# Patient Record
Sex: Female | Born: 1985
Health system: Southern US, Community
[De-identification: ages and names within clinical notes are randomized; demographics above are authoritative.]

## PROBLEM LIST (undated history)

## (undated) ENCOUNTER — Inpatient Hospital Stay (HOSPITAL_COMMUNITY): Payer: Self-pay

## (undated) DIAGNOSIS — F419 Anxiety disorder, unspecified: Secondary | ICD-10-CM

---
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
## 2013-05-23 ENCOUNTER — Emergency Department (HOSPITAL_BASED_OUTPATIENT_CLINIC_OR_DEPARTMENT_OTHER)
Admission: EM | Admit: 2013-05-23 | Discharge: 2013-05-23 | Disposition: A | Discharge status: Home / Self Care | Payer: Commercial Managed Care - PPO | Attending: Emergency Medicine | Admitting: Emergency Medicine

## 2013-05-23 ENCOUNTER — Encounter (HOSPITAL_BASED_OUTPATIENT_CLINIC_OR_DEPARTMENT_OTHER): Payer: Self-pay | Admitting: Emergency Medicine

## 2013-05-23 DIAGNOSIS — Z79899 Other long term (current) drug therapy: Secondary | ICD-10-CM | POA: Insufficient documentation

## 2013-05-23 DIAGNOSIS — T50905A Adverse effect of unspecified drugs, medicaments and biological substances, initial encounter: Secondary | ICD-10-CM

## 2013-05-23 DIAGNOSIS — T483X5A Adverse effect of antitussives, initial encounter: Secondary | ICD-10-CM | POA: Insufficient documentation

## 2013-05-23 DIAGNOSIS — F411 Generalized anxiety disorder: Secondary | ICD-10-CM | POA: Insufficient documentation

## 2013-05-23 DIAGNOSIS — R42 Dizziness and giddiness: Secondary | ICD-10-CM | POA: Insufficient documentation

## 2013-05-23 HISTORY — DX: Anxiety disorder, unspecified: F41.9

## 2013-05-23 MED ORDER — LORAZEPAM 1 MG PO TABS
1.0000 mg | ORAL_TABLET | Freq: Once | ORAL | Status: AC
  Administered 2013-05-23: 1 mg via ORAL
  Filled 2013-05-23: qty 1

## 2013-05-23 NOTE — ED Notes (Signed)
MD at bedside. 

## 2013-05-23 NOTE — ED Provider Notes (Signed)
CSN: [id number]     Arrival date & time 05/23/13  7169 History   First MD Initiated Contact with Patient 05/23/13 0730     Chief Complaint  Patient presents with  . Dizziness     (Consider location/radiation/quality/duration/timing/severity/associated sxs/prior Treatment) Patient is a 28 y.o. female presenting with dizziness.  Dizziness  Pt with history of anxiety reports she has had allergies and a cough recently. Last night before bed she took a dose of Delsym to help her sleep. She reports when she woke up at 5am, she felt dizzy, heart racing and like her body was trying jump out of her skin. Denies fever, vomiting or diarrhea. Husband reports her HR was 120s. She has since had some improvement. Reports similar symptoms in the past taking other medications with dextromethorphan, such as Mucinex-DM. She typically takes Ativan on a scheduled basis at 7am and 7pm. Has not yet had her morning dose.   Past Medical History  Diagnosis Date  . Anxiety    Past Surgical History  Procedure Laterality Date  . Addenoidectomy    . Nose surgery     No family history on file. History  Substance Use Topics  . Smoking status: Never Smoker   . Smokeless tobacco: Not on file  . Alcohol Use: No   OB History   Grav Para Term Preterm Abortions TAB SAB Ect Mult Living                 Review of Systems  Neurological: Positive for dizziness.   All other systems reviewed and are negative except as noted in HPI.     Allergies  Lexapro  Home Medications   Current Outpatient Rx  Name  Route  Sig  Dispense  Refill  . LORazepam (ATIVAN) 1 MG tablet   Oral   Take 1 mg by mouth 2 (two) times daily.         Marland Kitchen PRESCRIPTION MEDICATION      daily.          BP 141/84  Pulse 65  Temp(Src) 98.2 F (36.8 C) (Oral)  Resp 18  SpO2 98%  LMP 03/25/2013 Physical Exam  Nursing note and vitals reviewed. Constitutional: She is oriented to person, place, and time. She appears well-developed  and well-nourished.  HENT:  Head: Normocephalic and atraumatic.  Eyes: EOM are normal. Pupils are equal, round, and reactive to light.  Neck: Normal range of motion. Neck supple.  Cardiovascular: Normal rate, normal heart sounds and intact distal pulses.   Pulmonary/Chest: Effort normal and breath sounds normal.  Abdominal: Bowel sounds are normal. She exhibits no distension. There is no tenderness.  Musculoskeletal: Normal range of motion. She exhibits no edema and no tenderness.  Neurological: She is alert and oriented to person, place, and time. She has normal strength. No cranial nerve deficit or sensory deficit.  Skin: Skin is warm and dry. No rash noted.  Psychiatric: She has a normal mood and affect.    ED Course  Procedures (including critical care time) Labs Review Labs Reviewed - No data to display Imaging Review No results found.   EKG Interpretation None      MDM   Final diagnoses:  Adverse reaction to over-the-counter medication    Vitals and exam are unremarkable. Adverse reaction to dextromethorphan, possibly exacerbated by not having her scheduled ativan this AM as well. Doubt any utility in labs or imaging in the ED. Pt advised to avoid DM medications in the future. Home to  rest with plenty of fluids.     Biran Mayberry B. Karle Starch, MD 05/23/13 [phone]

## 2013-05-23 NOTE — ED Notes (Signed)
Pt reports she took a dose of Delsym last night at 2200, went to bed and awakened feeling, dizzy, jittery and "like my body was going to jump out of my skin". States Mucinex has a similar effect.

## 2013-05-23 NOTE — Discharge Instructions (Signed)

## 2014-02-15 NOTE — L&D Delivery Note (Signed)
Patient was C/C/+3 and pushed for 10 minutes with epidural.   NSVD  Female infant, Apgars 7,9, weight P.   The patient had midline second degree perineal laceration repaired with 2-0 vicrylR. Fundus was firm. EBL was expected amount. Placenta was delivered intact. Vagina was clear.  Baby was vigorous and doing skin to skin with mother.  Megan Stafford

## 2014-05-07 ENCOUNTER — Other Ambulatory Visit: Payer: Self-pay | Admitting: Obstetrics and Gynecology

## 2014-05-07 DIAGNOSIS — N644 Mastodynia: Secondary | ICD-10-CM

## 2014-05-07 LAB — OB RESULTS CONSOLE ABO/RH: RH TYPE: POSITIVE

## 2014-05-07 LAB — OB RESULTS CONSOLE ANTIBODY SCREEN: Antibody Screen: NEGATIVE

## 2014-05-07 LAB — OB RESULTS CONSOLE HIV ANTIBODY (ROUTINE TESTING): HIV: NONREACTIVE

## 2014-05-07 LAB — OB RESULTS CONSOLE GC/CHLAMYDIA
CHLAMYDIA, DNA PROBE: NEGATIVE
Gonorrhea: NEGATIVE

## 2014-05-07 LAB — OB RESULTS CONSOLE HEPATITIS B SURFACE ANTIGEN: Hepatitis B Surface Ag: NEGATIVE

## 2014-05-07 LAB — OB RESULTS CONSOLE RPR: RPR: NONREACTIVE

## 2014-05-07 LAB — OB RESULTS CONSOLE RUBELLA ANTIBODY, IGM: Rubella: IMMUNE

## 2014-05-10 ENCOUNTER — Ambulatory Visit
Admission: RE | Admit: 2014-05-10 | Discharge: 2014-05-10 | Disposition: A | Discharge status: Home / Self Care | Payer: 59 | Source: Ambulatory Visit | Attending: Obstetrics and Gynecology | Admitting: Obstetrics and Gynecology

## 2014-05-10 DIAGNOSIS — N644 Mastodynia: Secondary | ICD-10-CM

## 2014-10-18 LAB — OB RESULTS CONSOLE GBS: STREP GROUP B AG: NEGATIVE

## 2014-10-26 ENCOUNTER — Inpatient Hospital Stay (HOSPITAL_COMMUNITY)
Admission: AD | Admit: 2014-10-26 | Discharge: 2014-10-26 | Disposition: A | Discharge status: Home / Self Care | Payer: Commercial Managed Care - HMO | Source: Ambulatory Visit | Attending: Obstetrics & Gynecology | Admitting: Obstetrics & Gynecology

## 2014-10-26 ENCOUNTER — Encounter (HOSPITAL_COMMUNITY): Payer: Self-pay | Admitting: *Deleted

## 2014-10-26 DIAGNOSIS — O163 Unspecified maternal hypertension, third trimester: Secondary | ICD-10-CM | POA: Diagnosis present

## 2014-10-26 DIAGNOSIS — Z3A36 36 weeks gestation of pregnancy: Secondary | ICD-10-CM | POA: Diagnosis not present

## 2014-10-26 DIAGNOSIS — O133 Gestational [pregnancy-induced] hypertension without significant proteinuria, third trimester: Secondary | ICD-10-CM

## 2014-10-26 LAB — PROTEIN / CREATININE RATIO, URINE
Creatinine, Urine: 54 mg/dL
Total Protein, Urine: <6 mg/dL

## 2014-10-26 LAB — URINE MICROSCOPIC-ADD ON

## 2014-10-26 LAB — LACTATE DEHYDROGENASE: LDH: 171 U/L (ref 98–192)

## 2014-10-26 LAB — COMPREHENSIVE METABOLIC PANEL
ALBUMIN: 3 g/dL — AB (ref 3.5–5.0)
ALT: 13 U/L — ABNORMAL LOW (ref 14–54)
AST: 20 U/L (ref 15–41)
Alkaline Phosphatase: 96 U/L (ref 38–126)
Anion gap: 7 (ref 5–15)
BUN: 11 mg/dL (ref 6–20)
CO2: 23 mmol/L (ref 22–32)
Calcium: 9.3 mg/dL (ref 8.9–10.3)
Chloride: 104 mmol/L (ref 101–111)
Creatinine, Ser: 0.56 mg/dL (ref 0.44–1.00)
GFR calc Af Amer: >60 mL/min (ref >60)
GFR calc non Af Amer: >60 mL/min (ref >60)
GLUCOSE: 93 mg/dL (ref 65–99)
POTASSIUM: 3.5 mmol/L (ref 3.5–5.1)
SODIUM: 134 mmol/L — AB (ref 135–145)
TOTAL PROTEIN: 6.3 g/dL — AB (ref 6.5–8.1)
Total Bilirubin: 0.4 mg/dL (ref 0.3–1.2)

## 2014-10-26 LAB — URINALYSIS, ROUTINE W REFLEX MICROSCOPIC
Bilirubin Urine: NEGATIVE
Glucose, UA: NEGATIVE mg/dL
Hgb urine dipstick: NEGATIVE
KETONES UR: NEGATIVE mg/dL
NITRITE: NEGATIVE
Protein, ur: NEGATIVE mg/dL
SPECIFIC GRAVITY, URINE: 1.01 (ref 1.005–1.030)
UROBILINOGEN UA: 0.2 mg/dL (ref 0.0–1.0)
pH: 6.5 (ref 5.0–8.0)

## 2014-10-26 LAB — CBC WITH DIFFERENTIAL/PLATELET
BASOS PCT: 0 % (ref 0–1)
Basophils Absolute: 0.1 10*3/uL (ref 0.0–0.1)
EOS ABS: 0.4 10*3/uL (ref 0.0–0.7)
Eosinophils Relative: 3 % (ref 0–5)
HCT: 34.3 % — ABNORMAL LOW (ref 36.0–46.0)
Hemoglobin: 11.8 g/dL — ABNORMAL LOW (ref 12.0–15.0)
Lymphocytes Relative: 25 % (ref 12–46)
Lymphs Abs: 3.3 10*3/uL (ref 0.7–4.0)
MCH: 26.5 pg (ref 26.0–34.0)
MCHC: 34.4 g/dL (ref 30.0–36.0)
MCV: 77.1 fL — ABNORMAL LOW (ref 78.0–100.0)
MONOS PCT: 7 % (ref 3–12)
Monocytes Absolute: 1 10*3/uL (ref 0.1–1.0)
Neutro Abs: 8.5 10*3/uL — ABNORMAL HIGH (ref 1.7–7.7)
Neutrophils Relative %: 65 % (ref 43–77)
Platelets: 204 10*3/uL (ref 150–400)
RBC: 4.45 MIL/uL (ref 3.87–5.11)
RDW: 14.5 % (ref 11.5–15.5)
WBC: 13.1 10*3/uL — ABNORMAL HIGH (ref 4.0–10.5)

## 2014-10-26 LAB — URIC ACID: Uric Acid, Serum: 4.5 mg/dL (ref 2.3–6.6)

## 2014-10-26 MED ORDER — ACETAMINOPHEN 325 MG PO TABS
650.0000 mg | ORAL_TABLET | Freq: Once | ORAL | Status: AC
  Administered 2014-10-26: 650 mg via ORAL
  Filled 2014-10-26: qty 2

## 2014-10-26 NOTE — Discharge Instructions (Signed)
Hypertension During Pregnancy Hypertension, or high blood pressure, is when there is extra pressure inside your blood vessels that carry blood from the heart to the rest of your body (arteries). It can happen at any time in life, including pregnancy. Hypertension during pregnancy can cause problems for you and your baby. Your baby might not weigh as much as he or she should at birth or might be born early (premature). Very bad cases of hypertension during pregnancy can be life-threatening.  Different types of hypertension can occur during pregnancy. These include:  Chronic hypertension. This happens when a woman has hypertension before pregnancy and it continues during pregnancy.  Gestational hypertension. This is when hypertension develops during pregnancy.  Preeclampsia or toxemia of pregnancy. This is a very serious type of hypertension that develops only during pregnancy. It affects the whole body and can be very dangerous for both mother and baby.  Gestational hypertension and preeclampsia usually go away after your baby is born. Your blood pressure will likely stabilize within 6 weeks. Women who have hypertension during pregnancy have a greater chance of developing hypertension later in life or with future pregnancies. RISK FACTORS There are certain factors that make it more likely for you to develop hypertension during pregnancy. These include:  Having hypertension before pregnancy.  Having hypertension during a previous pregnancy.  Being overweight.  Being older than 40 years.  Being pregnant with more than one baby.  Having diabetes or kidney problems. SIGNS AND SYMPTOMS Chronic and gestational hypertension rarely cause symptoms. Preeclampsia has symptoms, which may include:  Increased protein in your urine. Your health care provider will check for this at every prenatal visit.  Swelling of your hands and face.  Rapid weight gain.  Headaches.  Visual changes.  Being  bothered by light.  Abdominal pain, especially in the upper right area.  Chest pain.  Shortness of breath.  Increased reflexes.  Seizures. These occur with a more severe form of preeclampsia, called eclampsia. DIAGNOSIS  You may be diagnosed with hypertension during a regular prenatal exam. At each prenatal visit, you may have:  Your blood pressure checked.  A urine test to check for protein in your urine. The type of hypertension you are diagnosed with depends on when you developed it. It also depends on your specific blood pressure reading.  Developing hypertension before 20 weeks of pregnancy is consistent with chronic hypertension.  Developing hypertension after 20 weeks of pregnancy is consistent with gestational hypertension.  Hypertension with increased urinary protein is diagnosed as preeclampsia.  Blood pressure measurements that stay above 834 systolic or 196 diastolic are a sign of severe preeclampsia. TREATMENT Treatment for hypertension during pregnancy varies. Treatment depends on the type of hypertension and how serious it is.  If you take medicine for chronic hypertension, you may need to switch medicines.  Medicines called ACE inhibitors should not be taken during pregnancy.  Low-dose aspirin may be suggested for women who have risk factors for preeclampsia.  If you have gestational hypertension, you may need to take a blood pressure medicine that is safe during pregnancy. Your health care provider will recommend the correct medicine.  If you have severe preeclampsia, you may need to be in the hospital. Health care providers will watch you and your baby very closely. You also may need to take medicine called magnesium sulfate to prevent seizures and lower blood pressure.  Sometimes, an early delivery is needed. This may be the case if the condition worsens. It would be  done to protect you and your baby. The only cure for preeclampsia is delivery.  Your health  care provider may recommend that you take one low-dose aspirin (81 mg) each day to help prevent high blood pressure during your pregnancy if you are at risk for preeclampsia. You may be at risk for preeclampsia if:  You had preeclampsia or eclampsia during a previous pregnancy.  Your baby did not grow as expected during a previous pregnancy.  You experienced preterm birth with a previous pregnancy.  You experienced a separation of the placenta from the uterus (placental abruption) during a previous pregnancy.  You experienced the loss of your baby during a previous pregnancy.  You are pregnant with more than one baby.  You have other medical conditions, such as diabetes or an autoimmune disease. HOME CARE INSTRUCTIONS  Schedule and keep all of your regular prenatal care appointments. This is important.  Take medicines only as directed by your health care provider. Tell your health care provider about all medicines you take.  Eat as little salt as possible.  Get regular exercise.  Do not drink alcohol.  Do not use tobacco products.  Do not drink products with caffeine.  Lie on your left side when resting. SEEK IMMEDIATE MEDICAL CARE IF:  You have severe abdominal pain.  You have sudden swelling in your hands, ankles, or face.  You gain 4 pounds (1.8 kg) or more in 1 week.  You vomit repeatedly.  You have vaginal bleeding.  You do not feel your baby moving as much.  You have a headache.  You have blurred or double vision.  You have muscle twitching or spasms.  You have shortness of breath.  You have blue fingernails or lips.  You have blood in your urine. MAKE SURE YOU:  Understand these instructions.  Will watch your condition.  Will get help right away if you are not doing well or get worse. Document Released: 10/20/2010 Document Revised: 06/18/2013 Document Reviewed: 08/31/2012 [facility] Patient Information 2015 [place], [place]. This information is not  intended to replace advice given to you by your health care provider. Make sure you discuss any questions you have with your health care provider.  Labor Induction  Labor induction is when steps are taken to cause a pregnant woman to begin the labor process. Most women go into labor on their own between 37 weeks and 42 weeks of the pregnancy. When this does not happen or when there is a medical need, methods may be used to induce labor. Labor induction causes a pregnant woman's uterus to contract. It also causes the cervix to soften (ripen), open (dilate), and thin out (efface). Usually, labor is not induced before 39 weeks of the pregnancy unless there is a problem with the baby or mother.  Before inducing labor, your health care provider will consider a number of factors, including the following:  The medical condition of you and the baby.   How many weeks along you are.   The status of the baby's lung maturity.   The condition of the cervix.   The position of the baby.  WHAT ARE THE REASONS FOR LABOR INDUCTION? Labor may be induced for the following reasons:  The health of the baby or mother is at risk.   The pregnancy is overdue by 1 week or more.   The water breaks but labor does not start on its own.   The mother has a health condition or serious illness, such as high  blood pressure, infection, placental abruption, or diabetes.  The amniotic fluid amounts are low around the baby.   The baby is distressed.  Convenience or wanting the baby to be born on a certain date is not a reason for inducing labor. WHAT METHODS ARE USED FOR LABOR INDUCTION? Several methods of labor induction may be used, such as:   Prostaglandin medicine. This medicine causes the cervix to dilate and ripen. The medicine will also start contractions. It can be taken by mouth or by inserting a suppository into the vagina.   Inserting a thin tube (catheter) with a balloon on the end into the vagina  to dilate the cervix. Once inserted, the balloon is expanded with water, which causes the cervix to open.   Stripping the membranes. Your health care provider separates amniotic sac tissue from the cervix, causing the cervix to be stretched and causing the release of a hormone called progesterone. This may cause the uterus to contract. It is often done during an office visit. You will be sent home to wait for the contractions to begin. You will then come in for an induction.   Breaking the water. Your health care provider makes a hole in the amniotic sac using a small instrument. Once the amniotic sac breaks, contractions should begin. This may still take hours to see an effect.   Medicine to trigger or strengthen contractions. This medicine is given through an IV access tube inserted into a vein in your arm.  All of the methods of induction, besides stripping the membranes, will be done in the hospital. Induction is done in the hospital so that you and the baby can be carefully monitored.  HOW LONG DOES IT TAKE FOR LABOR TO BE INDUCED? Some inductions can take up to 2-3 days. Depending on the cervix, it usually takes less time. It takes longer when you are induced early in the pregnancy or if this is your first pregnancy. If a mother is still pregnant and the induction has been going on for 2-3 days, either the mother will be sent home or a cesarean delivery will be needed. WHAT ARE THE RISKS ASSOCIATED WITH LABOR INDUCTION? Some of the risks of induction include:   Changes in fetal heart rate, such as too high, too low, or erratic.   Fetal distress.   Chance of infection for the mother and baby.   Increased chance of having a cesarean delivery.   Breaking off (abruption) of the placenta from the uterus (rare).   Uterine rupture (very rare).  When induction is needed for medical reasons, the benefits of induction may outweigh the risks. WHAT ARE SOME REASONS FOR NOT INDUCING  LABOR? Labor induction should not be done if:   It is shown that your baby does not tolerate labor.   You have had previous surgeries on your uterus, such as a myomectomy or the removal of fibroids.   Your placenta lies very low in the uterus and blocks the opening of the cervix (placenta previa).   Your baby is not in a head-down position.   The umbilical cord drops down into the birth canal in front of the baby. This could cut off the baby's blood and oxygen supply.   You have had a previous cesarean delivery.   There are unusual circumstances, such as the baby being extremely premature.  Document Released: 06/23/2006 Document Revised: 10/04/2012 Document Reviewed: 08/31/2012 [facility] Patient Information 2015 [place], [place]. This information is not intended to replace advice given  to you by your health care provider. Make sure you discuss any questions you have with your health care provider.

## 2014-10-26 NOTE — MAU Note (Signed)
Has been watching B/P for awhile. Today has been running 138/80 but when gets up and does anything was 144/90. Has had headache since this am. Did not take any meds. Maybe slight blurred vision but nothing else. Denies LOF and no bleeding.

## 2014-10-26 NOTE — MAU Provider Note (Signed)
History     CSN: [id number]  Arrival date and time: 10/26/14 8786   First Provider Initiated Contact with Patient 10/26/14 2116      Chief Complaint  Patient presents with  . Hypertension  . Headache   HPI Megan Stafford 29 y.o. [id number] @[redacted]w[redacted]d  presents to MAU with elevated blood pressures.  It was first noticed 3 days ago.  Her regular OB advised that she come in if greater than 140/90 with HA.  She has had a HA all day.  She increased water consumption but this did not improve her HA.  No use of tylenol or other medications.   She notes baby has not moved as much but she expects this is normal at this time of pregnancy.  Denies contractions, LOF, VB, dysuria.  She has had braxton hicks from time to time.  She is having swelling in feet.   She is noting blurry vision today.  No epigastric pain.   OB History    Gravida Para Term Preterm AB TAB SAB Ectopic Multiple Living   3 2 2       2       Past Medical History  Diagnosis Date  . Anxiety     Past Surgical History  Procedure Laterality Date  . Addenoidectomy    . Nose surgery      History reviewed. No pertinent family history.  Social History  Substance Use Topics  . Smoking status: Never Smoker   . Smokeless tobacco: None  . Alcohol Use: No    Allergies:  Allergies  Allergen Reactions  . Dextromethorphan Other (See Comments)    Reaction:  Pulse rate goes up   . Lexapro [Escitalopram Oxalate] Palpitations and Other (See Comments)    Reaction:  Increased BP     No prescriptions prior to admission    ROS Pertinent ROS in HPI.  All other systems are negative.   Physical Exam   Blood pressure 128/87, pulse 99, temperature 97.5 F (36.4 C), resp. rate 18, height 5\' 3"  (1.6 m), weight 191 lb (86.637 kg).  Physical Exam  Constitutional: She is oriented to person, place, and time. She appears well-developed and well-nourished. No distress.  HENT:  Head: Normocephalic and atraumatic.  Eyes: EOM are normal.   Neck: Normal range of motion.  Cardiovascular: Normal rate.   Respiratory: No respiratory distress.  GI: Soft. There is no tenderness.  Musculoskeletal: Normal range of motion.  Neurological: She is alert and oriented to person, place, and time.  Skin: Skin is warm and dry.   Results for orders placed or performed during the hospital encounter of 10/26/14 (from the past 24 hour(s))  Urinalysis, Routine w reflex microscopic (not at [facility])     Status: Abnormal   Collection Time: 10/26/14  8:15 PM  Result Value Ref Range   Color, Urine YELLOW YELLOW   APPearance HAZY (A) CLEAR   Specific Gravity, Urine 1.010 1.005 - 1.030   pH 6.5 5.0 - 8.0   Glucose, UA NEGATIVE NEGATIVE mg/dL   Hgb urine dipstick NEGATIVE NEGATIVE   Bilirubin Urine NEGATIVE NEGATIVE   Ketones, ur NEGATIVE NEGATIVE mg/dL   Protein, ur NEGATIVE NEGATIVE mg/dL   Urobilinogen, UA 0.2 0.0 - 1.0 mg/dL   Nitrite NEGATIVE NEGATIVE   Leukocytes, UA MODERATE (A) NEGATIVE  Urine microscopic-add on     Status: Abnormal   Collection Time: 10/26/14  8:15 PM  Result Value Ref Range   Squamous Epithelial / LPF MANY (A) RARE  WBC, UA 7-10 <3 WBC/hpf   Bacteria, UA FEW (A) RARE  Protein / creatinine ratio, urine     Status: None   Collection Time: 10/26/14  8:15 PM  Result Value Ref Range   Creatinine, Urine 54.00 mg/dL   Total Protein, Urine <6.00 mg/dL   Protein Creatinine Ratio        0.00 - 0.15 mg/mg[Cre]  CBC with Differential/Platelet     Status: Abnormal   Collection Time: 10/26/14  9:25 PM  Result Value Ref Range   WBC 13.1 (H) 4.0 - 10.5 K/uL   RBC 4.45 3.87 - 5.11 MIL/uL   Hemoglobin 11.8 (L) 12.0 - 15.0 g/dL   HCT 34.3 (L) 36.0 - 46.0 %   MCV 77.1 (L) 78.0 - 100.0 fL   MCH 26.5 26.0 - 34.0 pg   MCHC 34.4 30.0 - 36.0 g/dL   RDW 14.5 11.5 - 15.5 %   Platelets 204 150 - 400 K/uL   Neutrophils Relative % 65 43 - 77 %   Neutro Abs 8.5 (H) 1.7 - 7.7 K/uL   Lymphocytes Relative 25 12 - 46 %   Lymphs Abs 3.3 0.7  - 4.0 K/uL   Monocytes Relative 7 3 - 12 %   Monocytes Absolute 1.0 0.1 - 1.0 K/uL   Eosinophils Relative 3 0 - 5 %   Eosinophils Absolute 0.4 0.0 - 0.7 K/uL   Basophils Relative 0 0 - 1 %   Basophils Absolute 0.1 0.0 - 0.1 K/uL  Comprehensive metabolic panel     Status: Abnormal   Collection Time: 10/26/14  9:25 PM  Result Value Ref Range   Sodium 134 (L) 135 - 145 mmol/L   Potassium 3.5 3.5 - 5.1 mmol/L   Chloride 104 101 - 111 mmol/L   CO2 23 22 - 32 mmol/L   Glucose, Bld 93 65 - 99 mg/dL   BUN 11 6 - 20 mg/dL   Creatinine, Ser 0.56 0.44 - 1.00 mg/dL   Calcium 9.3 8.9 - 10.3 mg/dL   Total Protein 6.3 (L) 6.5 - 8.1 g/dL   Albumin 3.0 (L) 3.5 - 5.0 g/dL   AST 20 15 - 41 U/L   ALT 13 (L) 14 - 54 U/L   Alkaline Phosphatase 96 38 - 126 U/L   Total Bilirubin 0.4 0.3 - 1.2 mg/dL   GFR calc non Af Amer >60 >60 mL/min   GFR calc Af Amer >60 >60 mL/min   Anion gap 7 5 - 15  Uric acid     Status: None   Collection Time: 10/26/14  9:25 PM  Result Value Ref Range   Uric Acid, Serum 4.5 2.3 - 6.6 mg/dL  Lactate dehydrogenase     Status: None   Collection Time: 10/26/14  9:25 PM  Result Value Ref Range   LDH 171 98 - 192 U/L   Fetal Tracing: Baseline:120s Variability:mod Accelerations: 15x15 Decelerations:none Toco: irritability   MAU Course  Procedures  MDM Discussed blood pressures, symptoms and lab results with Dr. Alwyn Pea.  Fetus is reactive.  Per MD okay to discharge.  Pt to be seen in clinic next week and will discuss gestational htn with possibility of early induction.  Assessment and Plan  A:  1. Elevated blood pressure affecting pregnancy in third trimester, antepartum    P: Discharge to home F/u in clinic next week Labor precautions Patient may return to MAU as needed or if her condition were to change or worsen    Teague  Bobbye Morton 10/26/2014, 9:16 PM

## 2014-10-29 LAB — CULTURE, OB URINE

## 2014-11-19 ENCOUNTER — Inpatient Hospital Stay (HOSPITAL_COMMUNITY)
Admission: AD | Admit: 2014-11-19 | Discharge: 2014-11-22 | DRG: 775 | Disposition: A | Discharge status: Home / Self Care | Payer: Commercial Managed Care - HMO | Source: Ambulatory Visit | Attending: Obstetrics and Gynecology | Admitting: Obstetrics and Gynecology

## 2014-11-19 ENCOUNTER — Encounter (HOSPITAL_COMMUNITY): Payer: Self-pay | Admitting: *Deleted

## 2014-11-19 ENCOUNTER — Telehealth (HOSPITAL_COMMUNITY): Payer: Self-pay | Admitting: *Deleted

## 2014-11-19 ENCOUNTER — Inpatient Hospital Stay (HOSPITAL_COMMUNITY): Payer: Commercial Managed Care - HMO | Admitting: Anesthesiology

## 2014-11-19 ENCOUNTER — Encounter (HOSPITAL_COMMUNITY): Payer: Self-pay

## 2014-11-19 DIAGNOSIS — Z3A40 40 weeks gestation of pregnancy: Secondary | ICD-10-CM | POA: Diagnosis not present

## 2014-11-19 LAB — TYPE AND SCREEN
ABO/RH(D): A POS
Antibody Screen: NEGATIVE

## 2014-11-19 LAB — CBC
HCT: 36.1 % (ref 36.0–46.0)
Hemoglobin: 12.2 g/dL (ref 12.0–15.0)
MCH: 25.6 pg — AB (ref 26.0–34.0)
MCHC: 33.8 g/dL (ref 30.0–36.0)
MCV: 75.7 fL — AB (ref 78.0–100.0)
PLATELETS: 211 10*3/uL (ref 150–400)
RBC: 4.77 MIL/uL (ref 3.87–5.11)
RDW: 14.7 % (ref 11.5–15.5)
WBC: 13.3 10*3/uL — ABNORMAL HIGH (ref 4.0–10.5)

## 2014-11-19 LAB — ABO/RH: ABO/RH(D): A POS

## 2014-11-19 MED ORDER — CITRIC ACID-SODIUM CITRATE 334-500 MG/5ML PO SOLN: 30.0000 mL | ORAL | Status: DC | PRN

## 2014-11-19 MED ORDER — FLEET ENEMA 7-19 GM/118ML RE ENEM: 1.0000 | ENEMA | RECTAL | Status: DC | PRN

## 2014-11-19 MED ORDER — EPHEDRINE 5 MG/ML INJ: 10.0000 mg | INTRAVENOUS | Status: DC | PRN

## 2014-11-19 MED ORDER — OXYTOCIN 40 UNITS IN LACTATED RINGERS INFUSION - SIMPLE MED
1.0000 m[IU]/min | INTRAVENOUS | Status: DC
  Administered 2014-11-19: 2 m[IU]/min via INTRAVENOUS
  Filled 2014-11-19: qty 1000

## 2014-11-19 MED ORDER — ACETAMINOPHEN 325 MG PO TABS: 650.0000 mg | ORAL_TABLET | ORAL | Status: DC | PRN

## 2014-11-19 MED ORDER — OXYTOCIN BOLUS FROM INFUSION
500.0000 mL | INTRAVENOUS | Status: DC
  Administered 2014-11-20: 500 mL via INTRAVENOUS

## 2014-11-19 MED ORDER — FENTANYL 2.5 MCG/ML BUPIVACAINE 1/10 % EPIDURAL INFUSION (WH - ANES)
14.0000 mL/h | INTRAMUSCULAR | Status: DC | PRN
  Administered 2014-11-20: 14 mL/h via EPIDURAL
  Filled 2014-11-19: qty 125

## 2014-11-19 MED ORDER — OXYCODONE-ACETAMINOPHEN 5-325 MG PO TABS: 1.0000 | ORAL_TABLET | ORAL | Status: DC | PRN

## 2014-11-19 MED ORDER — LIDOCAINE HCL (PF) 1 % IJ SOLN
30.0000 mL | INTRAMUSCULAR | Status: DC | PRN
Start: 2014-11-19 — End: 2014-11-20
  Administered 2014-11-20: 30 mL via SUBCUTANEOUS
  Filled 2014-11-19: qty 30

## 2014-11-19 MED ORDER — LACTATED RINGERS IV SOLN
INTRAVENOUS | Status: DC
  Administered 2014-11-19: 20:00:00 via INTRAVENOUS

## 2014-11-19 MED ORDER — ONDANSETRON HCL 4 MG/2ML IJ SOLN: 4.0000 mg | Freq: Four times a day (QID) | INTRAMUSCULAR | Status: DC | PRN

## 2014-11-19 MED ORDER — PHENYLEPHRINE 40 MCG/ML (10ML) SYRINGE FOR IV PUSH (FOR BLOOD PRESSURE SUPPORT)
80.0000 ug | PREFILLED_SYRINGE | INTRAVENOUS | Status: DC | PRN
  Filled 2014-11-19: qty 20

## 2014-11-19 MED ORDER — LACTATED RINGERS IV SOLN
500.0000 mL | INTRAVENOUS | Status: DC | PRN
  Administered 2014-11-19: 500 mL via INTRAVENOUS

## 2014-11-19 MED ORDER — FENTANYL 2.5 MCG/ML BUPIVACAINE 1/10 % EPIDURAL INFUSION (WH - ANES): 14.0000 mL/h | INTRAMUSCULAR | Status: DC | PRN

## 2014-11-19 MED ORDER — DIPHENHYDRAMINE HCL 50 MG/ML IJ SOLN: 12.5000 mg | INTRAMUSCULAR | Status: DC | PRN

## 2014-11-19 MED ORDER — OXYCODONE-ACETAMINOPHEN 5-325 MG PO TABS: 2.0000 | ORAL_TABLET | ORAL | Status: DC | PRN

## 2014-11-19 MED ORDER — OXYTOCIN 40 UNITS IN LACTATED RINGERS INFUSION - SIMPLE MED: 62.5000 mL/h | INTRAVENOUS | Status: DC

## 2014-11-19 MED ORDER — TERBUTALINE SULFATE 1 MG/ML IJ SOLN: 0.2500 mg | Freq: Once | INTRAMUSCULAR | Status: DC | PRN

## 2014-11-19 NOTE — Progress Notes (Signed)
FHTs NST R  Toco q 10  SVE 6/60/-1, AROM clear.  Pitocin started.

## 2014-11-19 NOTE — Anesthesia Preprocedure Evaluation (Signed)

## 2014-11-19 NOTE — Progress Notes (Signed)
Report called to Dr. Philis Pique; orders received for admission and epidural PRN

## 2014-11-19 NOTE — Telephone Encounter (Signed)
Preadmission screen  

## 2014-11-19 NOTE — MAU Note (Signed)
Pt here with c/o contractions for the last couple of hours; was 5 cm in the office today. Reports positive fetal movement; denies any bleeding or leaking of fluid; GBS negative.

## 2014-11-19 NOTE — H&P (Signed)
29 y.o. [redacted]w[redacted]d  G3P2002 comes in c/o labor.  Otherwise has good fetal movement and no bleeding.  Past Medical History  Diagnosis Date  . Anxiety     Past Surgical History  Procedure Laterality Date  . Addenoidectomy    . Nose surgery      OB History  Gravida Para Term Preterm AB SAB TAB Ectopic Multiple Living  3 2 2       2     # Outcome Date GA Lbr Len/2nd Weight Sex Delivery Anes PTL Lv  3 Current           2 Term 2011 [redacted]w[redacted]d  4.026 kg (8 lb 14 oz) M Vag-Spont   Y  1 Term 2009 [redacted]w[redacted]d  3.487 kg (7 lb 11 oz) M Vag-Spont   Y      Social History   Social History  . Marital Status: Married    Spouse Name: N/A  . Number of Children: N/A  . Years of Education: N/A   Occupational History  . Not on file.   Social History Main Topics  . Smoking status: Never Smoker   . Smokeless tobacco: Not on file  . Alcohol Use: No  . Drug Use: No  . Sexual Activity: Not on file   Other Topics Concern  . Not on file   Social History Narrative   Dextromethorphan and Lexapro    Prenatal Transfer Tool  Maternal Diabetes: No Genetic Screening: Normal Maternal Ultrasounds/Referrals: Normal Fetal Ultrasounds or other Referrals:  None Maternal Substance Abuse:  No Significant Maternal Medications:  None Significant Maternal Lab Results: None  Other PNC: uncomplicated.    Filed Vitals:   11/19/14 1923  BP: 135/90  Pulse: 110  Temp: 98.2 F (36.8 C)  Resp: 20     Lungs/Cor:  NAD Abdomen:  soft, gravid Ex:  no cords, erythema SVE:  6/60/-2 per nurse FHTs:  140, good STV, NST R Toco:  q 4   A/P   Term labor.  GBS neg.  Austin Pongratz A

## 2014-11-20 ENCOUNTER — Inpatient Hospital Stay (HOSPITAL_COMMUNITY): Admission: RE | Admit: 2014-11-20 | Payer: Commercial Managed Care - HMO | Source: Ambulatory Visit

## 2014-11-20 ENCOUNTER — Encounter (HOSPITAL_COMMUNITY): Payer: Self-pay

## 2014-11-20 LAB — RPR: RPR Ser Ql: NONREACTIVE

## 2014-11-20 MED ORDER — ACETAMINOPHEN 325 MG PO TABS
650.0000 mg | ORAL_TABLET | ORAL | Status: DC | PRN
  Administered 2014-11-20 – 2014-11-22 (×6): 650 mg via ORAL
  Filled 2014-11-20 (×6): qty 2

## 2014-11-20 MED ORDER — ZOLPIDEM TARTRATE 5 MG PO TABS: 5.0000 mg | ORAL_TABLET | Freq: Every evening | ORAL | Status: DC | PRN

## 2014-11-20 MED ORDER — SODIUM CHLORIDE 0.9 % IJ SOLN
3.0000 mL | Freq: Two times a day (BID) | INTRAMUSCULAR | Status: DC
  Administered 2014-11-20: 3 mL via INTRAVENOUS

## 2014-11-20 MED ORDER — ONDANSETRON HCL 4 MG/2ML IJ SOLN
4.0000 mg | INTRAMUSCULAR | Status: DC | PRN
Start: 2014-11-20 — End: 2014-11-22

## 2014-11-20 MED ORDER — LIDOCAINE HCL (PF) 1 % IJ SOLN
INTRAMUSCULAR | Status: DC | PRN
  Administered 2014-11-20: 4 mL
  Administered 2014-11-20: 6 mL via EPIDURAL

## 2014-11-20 MED ORDER — FERROUS SULFATE 325 (65 FE) MG PO TABS
325.0000 mg | ORAL_TABLET | Freq: Two times a day (BID) | ORAL | Status: DC
Start: 2014-11-20 — End: 2014-11-22
  Administered 2014-11-20 – 2014-11-22 (×6): 325 mg via ORAL
  Filled 2014-11-20 (×6): qty 1

## 2014-11-20 MED ORDER — DIPHENHYDRAMINE HCL 25 MG PO CAPS: 25.0000 mg | ORAL_CAPSULE | Freq: Four times a day (QID) | ORAL | Status: DC | PRN

## 2014-11-20 MED ORDER — OXYCODONE-ACETAMINOPHEN 5-325 MG PO TABS
1.0000 | ORAL_TABLET | ORAL | Status: DC | PRN
  Administered 2014-11-21: 1 via ORAL
  Filled 2014-11-20 (×2): qty 1

## 2014-11-20 MED ORDER — INFLUENZA VAC SPLIT QUAD 0.5 ML IM SUSY
0.5000 mL | PREFILLED_SYRINGE | INTRAMUSCULAR | Status: AC
  Administered 2014-11-21: 0.5 mL via INTRAMUSCULAR
  Filled 2014-11-20: qty 0.5

## 2014-11-20 MED ORDER — TETANUS-DIPHTH-ACELL PERTUSSIS 5-2.5-18.5 LF-MCG/0.5 IM SUSP: 0.5000 mL | Freq: Once | INTRAMUSCULAR | Status: DC

## 2014-11-20 MED ORDER — ONDANSETRON HCL 4 MG PO TABS: 4.0000 mg | ORAL_TABLET | ORAL | Status: DC | PRN

## 2014-11-20 MED ORDER — LANOLIN HYDROUS EX OINT: TOPICAL_OINTMENT | CUTANEOUS | Status: DC | PRN

## 2014-11-20 MED ORDER — MAGNESIUM HYDROXIDE 400 MG/5ML PO SUSP: 30.0000 mL | ORAL | Status: DC | PRN

## 2014-11-20 MED ORDER — WITCH HAZEL-GLYCERIN EX PADS: 1.0000 "application " | MEDICATED_PAD | CUTANEOUS | Status: DC | PRN

## 2014-11-20 MED ORDER — SODIUM CHLORIDE 0.9 % IJ SOLN: 3.0000 mL | INTRAMUSCULAR | Status: DC | PRN

## 2014-11-20 MED ORDER — MEASLES, MUMPS & RUBELLA VAC ~~LOC~~ INJ
0.5000 mL | INJECTION | Freq: Once | SUBCUTANEOUS | Status: DC
  Filled 2014-11-20: qty 0.5

## 2014-11-20 MED ORDER — SODIUM CHLORIDE 0.9 % IV SOLN: 250.0000 mL | INTRAVENOUS | Status: DC | PRN

## 2014-11-20 MED ORDER — METHYLERGONOVINE MALEATE 0.2 MG PO TABS: 0.2000 mg | ORAL_TABLET | ORAL | Status: DC | PRN

## 2014-11-20 MED ORDER — OXYCODONE-ACETAMINOPHEN 5-325 MG PO TABS: 2.0000 | ORAL_TABLET | ORAL | Status: DC | PRN

## 2014-11-20 MED ORDER — DIBUCAINE 1 % RE OINT: 1.0000 "application " | TOPICAL_OINTMENT | RECTAL | Status: DC | PRN

## 2014-11-20 MED ORDER — SENNOSIDES-DOCUSATE SODIUM 8.6-50 MG PO TABS
2.0000 | ORAL_TABLET | ORAL | Status: DC
  Filled 2014-11-20: qty 2

## 2014-11-20 MED ORDER — METHYLERGONOVINE MALEATE 0.2 MG/ML IJ SOLN: 0.2000 mg | INTRAMUSCULAR | Status: DC | PRN

## 2014-11-20 MED ORDER — SIMETHICONE 80 MG PO CHEW: 80.0000 mg | CHEWABLE_TABLET | ORAL | Status: DC | PRN

## 2014-11-20 MED ORDER — BENZOCAINE-MENTHOL 20-0.5 % EX AERO
1.0000 "application " | INHALATION_SPRAY | CUTANEOUS | Status: DC | PRN
  Administered 2014-11-20: 1 via TOPICAL
  Filled 2014-11-20: qty 56

## 2014-11-20 MED ORDER — IBUPROFEN 800 MG PO TABS
800.0000 mg | ORAL_TABLET | Freq: Three times a day (TID) | ORAL | Status: DC
  Administered 2014-11-20 – 2014-11-22 (×9): 800 mg via ORAL
  Filled 2014-11-20 (×9): qty 1

## 2014-11-20 MED ORDER — PRENATAL MULTIVITAMIN CH
1.0000 | ORAL_TABLET | Freq: Every day | ORAL | Status: DC
  Administered 2014-11-20 – 2014-11-22 (×3): 1 via ORAL
  Filled 2014-11-20 (×3): qty 1

## 2014-11-20 NOTE — Progress Notes (Signed)
Patient is eating, ambulating, voiding.  Pain control is good.  Appropriate lochia.  No complaints.  Filed Vitals:   11/20/14 0456 11/20/14 0600 11/20/14 1000 11/20/14 1729  BP: 134/81 127/71 127/72 124/84  Pulse: 99 98 86 100  Temp: 97.7 F (36.5 C) 98.4 F (36.9 C) 98.3 F (36.8 C) 97.8 F (36.6 C)  TempSrc: Oral Oral Oral Oral  Resp: 18 16 18 18   Height:      Weight:      SpO2: 98% 97% 99% 99%    Fundus firm Perineum without swelling.  Lab Results  Component Value Date   WBC 13.3* 11/19/2014   HGB 12.2 11/19/2014   HCT 36.1 11/19/2014   MCV 75.7* 11/19/2014   PLT 211 11/19/2014    --/--/A POS, A POS (10/04 1940)  A/P Post partum day 0 Doing well Desires circ  Routine care.     Allyn Kenner

## 2014-11-20 NOTE — Anesthesia Procedure Notes (Signed)
Epidural Patient location during procedure: OB  Preanesthetic Checklist Completed: patient identified, site marked, surgical consent, pre-op evaluation, timeout performed, IV checked, risks and benefits discussed and monitors and equipment checked  Epidural Patient position: sitting Prep: site prepped and draped and DuraPrep Patient monitoring: continuous pulse ox and blood pressure Approach: midline Injection technique: LOR air  Needle:  Needle type: Tuohy  Needle gauge: 17 G Needle length: 9 cm and 9 Needle insertion depth: 5 cm cm Catheter type: closed end flexible Catheter size: 19 Gauge Catheter at skin depth: 10 cm Test dose: negative  Assessment Events: blood not aspirated, injection not painful, no injection resistance, negative IV test and no paresthesia  Additional Notes Dosing of Epidural:  1st dose, through catheter .............................................  Xylocaine 40 mg  2nd dose, through catheter, after waiting 3 minutes.........Xylocaine 60 mg    As each dose occurred, patient was free of IV sx; and patient exhibited no evidence of SA injection.  Patient is more comfortable after epidural dosed. Please see RN's note for documentation of vital signs,and FHR which are stable.  Patient reminded not to try to ambulate with numb legs, and that an RN must be present when she attempts to get up.       

## 2014-11-20 NOTE — Lactation Note (Signed)
This note was copied from the chart of [place]. Lactation Consultation Note  Patient Name: Megan Stafford [id number]'F Date: 11/20/2014 Reason for consult: Initial assessment  Baby 48 hours old. Mom reports that her milk did not "come in" with first child, but she nursed second child 6 months and gave EBM from freezer for an additional 6 months. Mom states that she used Fenugreek with second child as well. Mom given handouts regarding [place], Fenugreek, and breastfeeding cookies. Mom states baby nursed well just after delivery but has not been cueing to nurse since. Parents states that baby is "spitty" and gagging a lot. Discussed quick delivery as cause, and enc lots of STS and BF attempts, and holding baby high on shoulder and giving good pats. Mom given [facility] brochure, aware of OP/BFSG and [place] phone line assistance after D/C.  Maternal Data Has patient been taught Hand Expression?: Yes (Per mom.) Does the patient have breastfeeding experience prior to this delivery?: Yes  Feeding    LATCH Score/Interventions                      Lactation Tools Discussed/Used     Consult Status Consult Status: Follow-up Date: 11/21/14 Follow-up type: In-patient    Inocente Salles 11/20/2014, 9:19 AM

## 2014-11-20 NOTE — Lactation Note (Signed)
This note was copied from the chart of Megan Stafford. Lactation Consultation Note  Patient Name: Megan Stafford PQAES'L Date: 11/20/2014 Reason for consult: Follow-up assessment Baby 47 hours old. Parents state that baby not interested in BF and has had a small amount of reddish/brown spit-up X2. Demonstrated to parents how to hand express and spoon-feed colostrum. Mom has easily expressible colostrum. Baby tolerated 5 mls of EBM with spoon and finger. Enc mom to nurse with cues if baby cooperative, and/or offer EBM every 2 to three hours. Enc parents to offer at least a teaspoon at each feeding. Enc mom to start using DEBP later this evening if baby still not wanting to latch. Discussed assessment, interventions, and plan with patient's RN, Abby.  Maternal Data    Feeding Feeding Type: Breast Milk Length of feed: 10 min  LATCH Score/Interventions                      Lactation Tools Discussed/Used     Consult Status Consult Status: Follow-up Date: 11/21/14 Follow-up type: In-patient    Inocente Salles 11/20/2014, 2:27 PM

## 2014-11-20 NOTE — Anesthesia Postprocedure Evaluation (Signed)
  Anesthesia Post-op Note  Patient: Megan Stafford  Procedure(s) Performed: * No procedures listed *  Patient Location: Women's Unit  Anesthesia Type:Epidural  Level of Consciousness: awake  Airway and Oxygen Therapy: Patient Spontanous Breathing  Post-op Pain: mild  Post-op Assessment: Patient's Cardiovascular Status Stable and Respiratory Function Stable              Post-op Vital Signs: stable  Last Vitals:  Filed Vitals:   11/20/14 0600  BP: 127/71  Pulse: 98  Temp: 36.9 C  Resp: 16    Complications: No apparent anesthesia complications

## 2014-11-21 LAB — CBC
HCT: 32 % — ABNORMAL LOW (ref 36.0–46.0)
Hemoglobin: 10.6 g/dL — ABNORMAL LOW (ref 12.0–15.0)
MCH: 25.1 pg — AB (ref 26.0–34.0)
MCHC: 33.1 g/dL (ref 30.0–36.0)
MCV: 75.7 fL — ABNORMAL LOW (ref 78.0–100.0)
PLATELETS: 190 10*3/uL (ref 150–400)
RBC: 4.23 MIL/uL (ref 3.87–5.11)
RDW: 15 % (ref 11.5–15.5)
WBC: 12.2 10*3/uL — ABNORMAL HIGH (ref 4.0–10.5)

## 2014-11-21 NOTE — Progress Notes (Signed)
Spoke with patient regarding Zoloft prescription per CSW.  Patient reports begin prescribed 50mg  once a day at bedtime since beginning of pregnancy; took other medication prior to pregnancy. Patient reports taking medication 1 to 2 times as week as needed for sleep.  Last dose taken 1 time last week (09/25- 10/1)  Denies need for medication at this time.  Encouraged to speak with physician about Zoloft regimen.

## 2014-11-21 NOTE — Progress Notes (Signed)
Patient is eating, ambulating, voiding.  Pain control is good.  Appropriate lochia.  No complaints.  Filed Vitals:   11/20/14 1000 11/20/14 1729 11/20/14 2148 11/21/14 0549  BP: 127/72 124/84 138/82 118/76  Pulse: 86 100 104 87  Temp: 98.3 F (36.8 C) 97.8 F (36.6 C) 97.9 F (36.6 C) 98 F (36.7 C)  TempSrc: Oral Oral Oral Oral  Resp: 18 18 18 18   Height:      Weight:      SpO2: 99% 99% 98% 98%    Fundus firm Perineum without swelling.  Lab Results  Component Value Date   WBC 12.2* 11/21/2014   HGB 10.6* 11/21/2014   HCT 32.0* 11/21/2014   MCV 75.7* 11/21/2014   PLT 190 11/21/2014    --/--/A POS, A POS (10/04 1940) / rubella immune  A/P G3P3 PPD #1 s/p TSVD Doing well.  Desires d/c tomorrow.  Desires circumcisison. Discussed r/b/a of the procedure. Reviewed that circumcision is an elective surgical procedure and not considered medically necessary. Reviewed the risks of the procedure including the risk of infection, bleeding, damage to surrounding structures, including scrotum, shaft, urethra and head of penis, and an undesired cosmetic effect requiring additional procedures for revision. Consent signed.     [place]

## 2014-11-21 NOTE — Progress Notes (Signed)
CLINICAL SOCIAL WORK MATERNAL/CHILD NOTE  Patient Details  Name: Megan Stafford MRN: [id number] Date of Birth: 2012/05/09  Date:  2012-03-27  Clinical Social Worker Initiating Note:  Lucita Ferrara, LCSW and Elissa Hefty, MSW intern   Date/ Time Initiated:  11/21/14/1200     Child's Name:  Megan Stafford    Legal Guardian:  Megan Stafford (MOB) and Megan Stafford (FOB)    Need for Interpreter:  None   Date of Referral:  21-Mar-2014     Reason for Referral:  Behavioral Health Issues, including SI    Referral Source:  [facility]   Address:  [place], [place] [address]  Phone number:  [phone]   Household Members:  Self, Minor Children, Spouse   Natural Supports (not living in the home):  Children, Immediate Family, Friends, Spouse/significant other   Professional Supports: None   Employment: Full-time   Type of Work:     Education:  Engineer, maintenance Resources:  Multimedia programmer   Other Resources:      Cultural/Religious Considerations Which May Impact Care:  None Reported   Strengths:  Ability to meet basic needs , Home prepared for child    Risk Factors/Current Problems:  Mental Health Concerns: History of anxiety and depression. MOB was prescribed Lorezapam by her PCP but then was prescribed  Zoloft because it was safer to take during her pregnancy. 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 MOB stated that once she graduated in May 2016 and obtained a job her anxiety has decreased and she only takes Zoloft occassionally.   Cognitive State:  Alert , Flight of Ideas , Goal Oriented , Linear Thinking , Insightful    Mood/Affect:  Happy , Interested , Bright , Calm , Comfortable , Relaxed    CSW Assessment:  MSW intern and CSW presented in patient's room due to a consult being placed by the central nursery because of a history of anxiety and depression.  FOB was present in the room. MOB consented for MSW intern and CSW to engage along with FOB. MOB presented to be in a happy mood as evidence by constantly smiling during the assessment and being actively engaged during the assessment. MOB shared she was content with the birthing process and was transitioning  well into postpartum. MOB voiced having four children in their blended family, two older one's from FOB and two younger children that are hers. MOB shared all the children were excited to meet the infant and felt happy that all of them were accepting of the infant. MOB expressed feeling well supported by all her children, FOB, their immediate family, and friends. MOB shared her grandmother lives three miles away from her and she is her main contact when she needs help. FOB shared his mother lives a mile away from their home and is a good support system for them as well. FOB also stated his mother has an in-home day care and that is were they plan for the infant to go once they both return to work. MOB voiced she doesn't mind asking for help and appreciates receiving help form family members and friends. MOB shared she will be taking 6 weeks of maternity leave and FOB will be taking 4 weeks off. MOB stated she is prepared to go home with the infant and has met all of the infant's basic needs.   MSW intern inquired about MOB's mental health history.  MOB stated her anxiety and depression were due to her being overwhelmed with school and working three different jobs. MOB stated she would constantly worry, get emotional, and feel tired. MOB shared she was prescribed Lorezapam by her PCP but then had to transition into Zoloft because of safety concerns while being pregnant. MOB voiced her anxiety and depression has decreased since she graduated from school in May and acquired full-time employment. MOB shared she only takes her Zoloft as needed but always has it available to her. FOB voiced having the prescription in  the room if needs arise since they are not getting enough sleep with caring for the infant. CSW informed MOB and FOB about the hospital's policy in regard to outside medications and made them aware of their option of obtaining the prescription by their OB while in the hospital. FOB and MOB agreed to let their nurse know they wanted the Zoloft prescribed just in case needs arise. MSW intern asked MOB about her future plans in regard to her mental health. MOB stated she planned to restart her Zoloft medication only when  needs arise but was aware of her resources. MOB denied any concerns for the future in regard to symptoms of anxiety and depression but displayed to be very proactive as evidence by having her prescription available.   MW intern provided MOB with education on perinatal mood disorders and informed her of the hospital's support group, "Feelings After Birth." MOB denied having any concerns about the topic but was appreciative for the information provided. MOB denied having any further questions but agreed to contact CSW if needs arise.   CSW consulted with MOB's nurse about the need of ordering Zoloft during admission. RN to speak to MD about the prescription.   CSW Plan/Description:   Patient/Family Education- MSW intern provided education on perinatal mood disorders. No Further Intervention Required/No Barriers to Discharge    Jaide Hillenburg, Student-SW 11/21/2014, 12:40 PM  

## 2014-11-21 NOTE — Lactation Note (Signed)
This note was copied from the chart of [place]. Lactation Consultation Note  Patient Name: Megan Stafford PQAES'L Date: 11/21/2014 Reason for consult: Follow-up assessment  Follow-up at 28 hours old.  Infant in nursery for a circumcision.  Mom is P3 with 6 months experience with second child + additional 6 months of providing EBM from freezer.  No problems with BF second child.  Infant has breastfed x7 (10-30 min) + spoon fed EBM x1 (5 ml) in past 24 hours; voids-4 in 24 hrs/ 5 life; stools-3 in 24 hours/4 life.  LS by RN-9-10.  Weight loss 4%.   Mom reports no problems with breastfeeding; denies any pain.  Mom states the infant is breastfeeding well compared to previous experiences. LC did not get to see infant latch d/t infant in nursery for circumcision procedure. Mom reports she has a Medela DEBP at home she plans to use after discharge if needed.  Informed of outpatient support and hospital support group for resources after discharge if needed.   Encouraged to call for assistance if needed.     Consult Status Consult Status: Follow-up Date: 11/22/14 Follow-up type: In-patient    Megan Stafford 11/21/2014, 1:19 PM

## 2014-11-21 NOTE — Progress Notes (Deleted)
RN suggested it would be good to speak with mother regarding engorgement and choices about milk supply. Upon entering the room, family member stated that now would not be a good time. Family member came outside the room to speak w/ [place].   She stated the MD has already reviewed engorgement care and using cabbage leaves to dry up milk supply. Provided family member with donation brochure and suggest she call if she would like further assistance.

## 2014-11-22 NOTE — Progress Notes (Signed)
Patient discharged home with husband... Discharge instructions reviewed with patient and she verbalized understanding... Condition stable... No equipment... Ambulated to car with E. Shellsea Borunda, RN. 

## 2014-11-22 NOTE — Lactation Note (Signed)
This note was copied from the chart of [name]. Lactation Consultation Note  Patient Name: Megan Stafford [id number]'N Date: 11/22/2014 Reason for consult: Follow-up assessment Baby 5 hours old. Mom reports that baby was sleepy after circumcision, and then given a second dose of Tylenol for pain after circumcision, so remained sleepy and not nursing well. Parents report that they gave some formula, but baby spit it back up. Enc to supplement with EBM as needed. Mom states baby latches and nurses well when not sleepy. Enc continuing to offering lots of STS and nurse with cues. Mom aware of OP/BFSG and [place] phone line assistance after D/C. Mom has personal DEBP at home.   Maternal Data    Feeding Feeding Type: Breast Fed Length of feed: 33 min  LATCH Score/Interventions                      Lactation Tools Discussed/Used     Consult Status Consult Status: PRN    Lesli Albee, Oluw[place]isin Hustead 11/22/2014, 1:00 PM

## 2014-11-22 NOTE — Discharge Summary (Signed)
Obstetric Discharge Summary Reason for Admission: onset of labor Prenatal Procedures: ultrasound Intrapartum Procedures: spontaneous vaginal delivery Postpartum Procedures: none Complications-Operative and Postpartum: 2nd degree perineal laceration HEMOGLOBIN  Date Value Ref Range Status  11/21/2014 10.6* 12.0 - 15.0 g/dL Final   HCT  Date Value Ref Range Status  11/21/2014 32.0* 36.0 - 46.0 % Final    Physical Exam:  General: alert Lochia: appropriate Uterine Fundus: firm   Discharge Diagnoses: Term Pregnancy-delivered  Discharge Information: Date: 11/22/2014 Activity: pelvic rest Diet: routine Medications: PNV and Ibuprofen Condition: stable Instructions: refer to practice specific booklet Discharge to: home Follow-up Information    Follow up with HORVATH,MICHELLE A, MD. Schedule an appointment as soon as possible for a visit in 1 month.   Specialty:  Obstetrics and Gynecology   Contact information:   [place] [address] [address] [phone]       Newborn Data: Live born female  Birth Weight: 7 lb 14.4 oz (3583 g) APGAR: 7, 9  Home with mother.  Julietta Batterman E 11/22/2014, 6:08 PM

## 2016-03-16 DIAGNOSIS — J029 Acute pharyngitis, unspecified: Secondary | ICD-10-CM | POA: Diagnosis not present

## 2016-03-16 DIAGNOSIS — R05 Cough: Secondary | ICD-10-CM | POA: Diagnosis not present

## 2016-03-16 DIAGNOSIS — J039 Acute tonsillitis, unspecified: Secondary | ICD-10-CM | POA: Diagnosis not present

## 2016-03-16 DIAGNOSIS — B370 Candidal stomatitis: Secondary | ICD-10-CM | POA: Diagnosis not present

## 2016-06-30 DIAGNOSIS — D2239 Melanocytic nevi of other parts of face: Secondary | ICD-10-CM | POA: Diagnosis not present

## 2016-06-30 DIAGNOSIS — D225 Melanocytic nevi of trunk: Secondary | ICD-10-CM | POA: Diagnosis not present

## 2016-06-30 DIAGNOSIS — D485 Neoplasm of uncertain behavior of skin: Secondary | ICD-10-CM | POA: Diagnosis not present

## 2016-07-27 DIAGNOSIS — R0981 Nasal congestion: Secondary | ICD-10-CM | POA: Diagnosis not present

## 2016-07-27 DIAGNOSIS — J069 Acute upper respiratory infection, unspecified: Secondary | ICD-10-CM | POA: Diagnosis not present

## 2016-10-19 DIAGNOSIS — M7711 Lateral epicondylitis, right elbow: Secondary | ICD-10-CM | POA: Diagnosis not present

## 2016-10-19 DIAGNOSIS — M79601 Pain in right arm: Secondary | ICD-10-CM | POA: Diagnosis not present

## 2016-10-28 DIAGNOSIS — Z79899 Other long term (current) drug therapy: Secondary | ICD-10-CM | POA: Diagnosis not present

## 2016-11-04 DIAGNOSIS — J069 Acute upper respiratory infection, unspecified: Secondary | ICD-10-CM | POA: Diagnosis not present

## 2017-01-12 DIAGNOSIS — J069 Acute upper respiratory infection, unspecified: Secondary | ICD-10-CM | POA: Diagnosis not present

## 2017-01-12 DIAGNOSIS — R05 Cough: Secondary | ICD-10-CM | POA: Diagnosis not present

## 2017-03-22 DIAGNOSIS — J040 Acute laryngitis: Secondary | ICD-10-CM | POA: Diagnosis not present

## 2017-03-25 DIAGNOSIS — J22 Unspecified acute lower respiratory infection: Secondary | ICD-10-CM | POA: Diagnosis not present

## 2017-04-12 DIAGNOSIS — J22 Unspecified acute lower respiratory infection: Secondary | ICD-10-CM | POA: Diagnosis not present

## 2017-05-13 ENCOUNTER — Other Ambulatory Visit: Payer: Self-pay

## 2017-05-13 ENCOUNTER — Emergency Department (HOSPITAL_COMMUNITY): Payer: 59

## 2017-05-13 ENCOUNTER — Encounter (HOSPITAL_COMMUNITY): Payer: Self-pay | Admitting: Emergency Medicine

## 2017-05-13 DIAGNOSIS — Z5321 Procedure and treatment not carried out due to patient leaving prior to being seen by health care provider: Secondary | ICD-10-CM | POA: Insufficient documentation

## 2017-05-13 DIAGNOSIS — R079 Chest pain, unspecified: Secondary | ICD-10-CM | POA: Insufficient documentation

## 2017-05-13 DIAGNOSIS — R0789 Other chest pain: Secondary | ICD-10-CM | POA: Diagnosis not present

## 2017-05-13 LAB — BASIC METABOLIC PANEL
ANION GAP: 10 (ref 5–15)
BUN: 5 mg/dL — ABNORMAL LOW (ref 6–20)
CALCIUM: 9.3 mg/dL (ref 8.9–10.3)
CO2: 24 mmol/L (ref 22–32)
Chloride: 103 mmol/L (ref 101–111)
Creatinine, Ser: 0.82 mg/dL (ref 0.44–1.00)
Glucose, Bld: 103 mg/dL — ABNORMAL HIGH (ref 65–99)
Potassium: 3.3 mmol/L — ABNORMAL LOW (ref 3.5–5.1)
Sodium: 137 mmol/L (ref 135–145)

## 2017-05-13 LAB — I-STAT BETA HCG BLOOD, ED (MC, WL, AP ONLY): I-stat hCG, quantitative: <5 m[IU]/mL (ref <5)

## 2017-05-13 LAB — I-STAT TROPONIN, ED: TROPONIN I, POC: 0 ng/mL (ref 0.00–0.08)

## 2017-05-13 LAB — CBC
HCT: 38.7 % (ref 36.0–46.0)
HEMOGLOBIN: 13 g/dL (ref 12.0–15.0)
MCH: 28 pg (ref 26.0–34.0)
MCHC: 33.6 g/dL (ref 30.0–36.0)
MCV: 83.4 fL (ref 78.0–100.0)
Platelets: 301 10*3/uL (ref 150–400)
RBC: 4.64 MIL/uL (ref 3.87–5.11)
RDW: 13 % (ref 11.5–15.5)
WBC: 9.7 10*3/uL (ref 4.0–10.5)

## 2017-05-13 NOTE — ED Triage Notes (Signed)
Pt to ED via [facility].  Reports tightness in center of chest, nausea, lightheadedness, and tingling in bilateral hands/fingers.  History of anxiety but states this feels different.  Pain 7/10 and was completely resolved after NTG x 2 and Asa 324mg  PTA by EMS.

## 2017-05-14 ENCOUNTER — Emergency Department (HOSPITAL_COMMUNITY)
Admission: EM | Admit: 2017-05-14 | Discharge: 2017-05-14 | Disposition: A | Discharge status: Home / Self Care | Payer: 59 | Attending: Emergency Medicine | Admitting: Emergency Medicine

## 2017-05-14 NOTE — ED Notes (Signed)
Pt stated that she felt better from the [facility] and asa from EMS and wanted to go home.

## 2017-05-16 ENCOUNTER — Other Ambulatory Visit: Payer: Self-pay

## 2017-05-16 ENCOUNTER — Other Ambulatory Visit: Payer: Self-pay | Admitting: *Deleted

## 2017-05-16 DIAGNOSIS — N644 Mastodynia: Secondary | ICD-10-CM

## 2017-05-16 DIAGNOSIS — R42 Dizziness and giddiness: Secondary | ICD-10-CM

## 2017-05-16 DIAGNOSIS — R110 Nausea: Secondary | ICD-10-CM

## 2017-05-16 DIAGNOSIS — R202 Paresthesia of skin: Secondary | ICD-10-CM

## 2017-05-16 DIAGNOSIS — F419 Anxiety disorder, unspecified: Secondary | ICD-10-CM | POA: Insufficient documentation

## 2017-05-16 DIAGNOSIS — N920 Excessive and frequent menstruation with regular cycle: Secondary | ICD-10-CM | POA: Insufficient documentation

## 2017-05-16 DIAGNOSIS — R0789 Other chest pain: Secondary | ICD-10-CM

## 2017-05-16 HISTORY — DX: Paresthesia of skin: R20.2

## 2017-05-16 HISTORY — DX: Mastodynia: N64.4

## 2017-05-16 HISTORY — DX: Other chest pain: R07.89

## 2017-05-16 HISTORY — DX: Dizziness and giddiness: R42

## 2017-05-16 HISTORY — DX: Nausea: R11.0

## 2017-05-16 HISTORY — DX: Excessive and frequent menstruation with regular cycle: N92.0

## 2017-05-19 ENCOUNTER — Ambulatory Visit: Payer: 59 | Admitting: Cardiology

## 2017-05-20 ENCOUNTER — Encounter: Payer: Self-pay | Admitting: Cardiology

## 2017-05-20 ENCOUNTER — Ambulatory Visit: Payer: 59 | Admitting: Cardiology

## 2017-05-20 VITALS — BP 160/72 | HR 89 | Ht 63.0 in | Wt 156.0 lb

## 2017-05-20 DIAGNOSIS — R0789 Other chest pain: Secondary | ICD-10-CM | POA: Diagnosis not present

## 2017-05-20 DIAGNOSIS — R002 Palpitations: Secondary | ICD-10-CM

## 2017-05-20 HISTORY — DX: Palpitations: R00.2

## 2017-05-20 MED ORDER — METOPROLOL SUCCINATE ER 25 MG PO TB24: 25.0000 mg | ORAL_TABLET | Freq: Every day | ORAL | 2 refills | Status: DC

## 2017-05-20 NOTE — Progress Notes (Signed)
Cardiology Office Note:    Date:  05/20/2017   ID:  Megan Stafford, DOB [birth year], MRN [id number]  PCP:  Renaldo Reel, PA  Cardiologist:  Jenean Lindau, MD   Referring MD: No ref. provider found    ASSESSMENT:    1. Chest tightness   2. Palpitations    PLAN:    In order of problems listed above:  1. I discussed my findings with the patient at extensive length.  It is possible that she has some form of supra ventricular tachyarrhythmia.  This is only one symptomatic episode ever.  I am not going to begin her on empiric medication.  Will have an echocardiogram to assess murmur heard on auscultation.  The patient also will get a exercise treadmill stress test to assess her chest tightness.  I do not think this is of coronary etiology however I want to reassure her.  I told her that if the symptoms occur again she will take metoprolol succinate 25 mg only on a as needed basis.  She knows to go to the nearest emergency room for any concerning symptoms.  Antiarrhythmic issues benefits and risks were explained and she is not keen on it at this time and I agree with her. 2. Patient will be seen in follow-up appointment in 6 months or earlier if the patient has any concerns.   Medication Adjustments/Labs and Tests Ordered: Current medicines are reviewed at length with the patient today.  Concerns regarding medicines are outlined above.  Orders Placed This Encounter  Procedures  . EXERCISE TOLERANCE TEST (ETT)  . ECHOCARDIOGRAM COMPLETE   Meds ordered this encounter  Medications  . metoprolol succinate (TOPROL-XL) 25 MG 24 hr tablet    Sig: Take 1 tablet (25 mg total) by mouth daily.    Dispense:  90 tablet    Refill:  2     History of Present Illness:    Megan Stafford is a 32 y.o. female who is being seen today for the evaluation of palpitations and chest tightness at the request of her primary care provider.  The patient is a pleasant 32 year old female.  She has no significant  past medical history.  The patient mentions to me that one evening when she was sitting with her 30-year-old she had sudden onset of palpitations.  Her smart watch revealed that her heart rate was about 130.  This lasted for about 45 minutes.  She called EMS.  Her husband is an EMT himself.  By the time the patient was evaluated by EMS her heart rate was better.  She has never had these symptoms before.  She has history of panic attacks but she is never had this issue with panic attacks and at that time she was not having a panic attack either..  This is the only time she has had this episode.  She denies any passing out spells.  No chest pain orthopnea or PND.  She had only some chest tightness at that time.  She is a very avid exerciser and is very physically active.  Past Medical History:  Diagnosis Date  . Anxiety     Past Surgical History:  Procedure Laterality Date  . addenoidectomy  2001  . RHINOPLASTY  2001    Current Medications: Current Meds  Medication Sig  . albuterol (VENTOLIN HFA) 108 (90 Base) MCG/ACT inhaler Inhale 90 mcg into the lungs daily.  Francia Greaves THYROID 90 MG tablet   . ELDERBERRY PO Take 575 mg by  mouth daily as needed.  . fluticasone (FLONASE) 50 MCG/ACT nasal spray Place 2 sprays into both nostrils daily.  Marland Kitchen levonorgestrel (MIRENA, 52 MG,) 20 MCG/24HR IUD Mirena 20 mcg/24 hours (5 yrs) 52 mg intrauterine device  . levonorgestrel-ethinyl estradiol (AVIANE,ALESSE,LESSINA) 0.1-20 MG-MCG tablet Take 1 tablet by mouth daily.  . sertraline (ZOLOFT) 100 MG tablet Take 100 mg by mouth daily.  . [DISCONTINUED] Drospirenone-Ethinyl Estradiol-Levomefol (TYDEMY) 3-0.03-0.451 MG tablet Take 1 tablet by mouth daily.     Allergies:   Escitalopram oxalate; Bupivacaine hcl; Dextromethorphan; and Vortioxetine   Social History   Socioeconomic History  . Marital status: Married    Spouse name: Not on file  . Number of children: Not on file  . Years of education: Not on file    . Highest education level: Not on file  Occupational History  . Not on file  Social Needs  . Financial resource strain: Not on file  . Food insecurity:    Worry: Not on file    Inability: Not on file  . Transportation needs:    Medical: Not on file    Non-medical: Not on file  Tobacco Use  . Smoking status: Former Research scientist (life sciences)  . Smokeless tobacco: Never Used  Substance and Sexual Activity  . Alcohol use: Yes    Comment: Occasional alcohol use  . Drug use: No  . Sexual activity: Yes    Birth control/protection: IUD  Lifestyle  . Physical activity:    Days per week: Not on file    Minutes per session: Not on file  . Stress: Not on file  Relationships  . Social connections:    Talks on phone: Not on file    Gets together: Not on file    Attends religious service: Not on file    Active member of club or organization: Not on file    Attends meetings of clubs or organizations: Not on file    Relationship status: Not on file  Other Topics Concern  . Not on file  Social History Narrative  . Not on file     Family History: The patient's family history includes Arthritis in her father and paternal grandfather; Asthma in her father and mother; Atrial fibrillation in her maternal grandfather, mother, and paternal grandmother; Cancer in her paternal aunt and paternal grandmother; Diabetes in her maternal grandfather, maternal grandmother, and maternal uncle; Heart disease in her maternal grandfather and maternal grandmother; Hyperlipidemia in her maternal grandfather and maternal grandmother; Hypertension in her maternal grandfather, maternal grandmother, and mother.  ROS:   Please see the history of present illness.    All other systems reviewed and are negative.  EKGs/Labs/Other Studies Reviewed:    The following studies were reviewed today: EKG reveals sinus rhythm and nonspecific ST changes   Recent Labs: 05/13/2017: BUN 5; Creatinine, Ser 0.82; Hemoglobin 13.0; Platelets 301;  Potassium 3.3; Sodium 137  Recent Lipid Panel No results found for: CHOL, TRIG, HDL, CHOLHDL, VLDL, LDLCALC, LDLDIRECT  Physical Exam:    VS:  BP (!) 160/72 (BP Location: Left Arm, Patient Position: Sitting, Cuff Size: Normal)   Pulse 89   Ht 5\' 3"  (1.6 m)   Wt 156 lb (70.8 kg)   SpO2 98%   BMI 27.63 kg/m     Wt Readings from Last 3 Encounters:  05/20/17 156 lb (70.8 kg)  11/19/14 196 lb (88.9 kg)  10/26/14 191 lb (86.6 kg)     GEN: Patient is in no acute distress HEENT: Normal NECK:  No JVD; No carotid bruits LYMPHATICS: No lymphadenopathy CARDIAC: S1 S2 regular, 2/6 systolic murmur at the apex. RESPIRATORY:  Clear to auscultation without rales, wheezing or rhonchi  ABDOMEN: Soft, non-tender, non-distended MUSCULOSKELETAL:  No edema; No deformity  SKIN: Warm and dry NEUROLOGIC:  Alert and oriented x 3 PSYCHIATRIC:  Normal affect    Signed, Jenean Lindau, MD  05/20/2017 3:46 PM    [facility]

## 2017-05-20 NOTE — Patient Instructions (Signed)
Medication Instructions:  Your physician has recommended you make the following change in your medication:  START metoprolol extended release 25 mg daily  Labwork: None  Testing/Procedures: Your physician has requested that you have an exercise tolerance test. For further information please visit [email or web address]. Please also follow instruction sheet, as given.  Your physician has requested that you have an echocardiogram. Echocardiography is a painless test that uses sound waves to create images of your heart. It provides your doctor with information about the size and shape of your heart and how well your heart's chambers and valves are working. This procedure takes approximately one hour. There are no restrictions for this procedure.  Follow-Up: Your physician recommends that you schedule a follow-up appointment in: 6 months  Any Other Special Instructions Will Be Listed Below (If Applicable).     If you need a refill on your cardiac medications before your next appointment, please call your pharmacy.   [place], RN, BSN  Metoprolol extended-release tablets What is this medicine? METOPROLOL (me TOE proe lole) is a beta-blocker. Beta-blockers reduce the workload on the heart and help it to beat more regularly. This medicine is used to treat high blood pressure and to prevent chest pain. It is also used to after a heart attack and to prevent an additional heart attack from occurring. This medicine may be used for other purposes; ask your health care provider or pharmacist if you have questions. COMMON BRAND NAME(S): toprol, Toprol XL What should I tell my health care provider before I take this medicine? They need to know if you have any of these conditions: -diabetes -heart or vessel disease like slow heart rate, worsening heart failure, heart block, sick sinus syndrome or Raynaud's disease -kidney disease -liver disease -lung or breathing disease, like  asthma or emphysema -pheochromocytoma -thyroid disease -an unusual or allergic reaction to metoprolol, other beta-blockers, medicines, foods, dyes, or preservatives -pregnant or trying to get pregnant -breast-feeding How should I use this medicine? Take this medicine by mouth with a glass of water. Follow the directions on the prescription label. Do not crush or chew. Take this medicine with or immediately after meals. Take your doses at regular intervals. Do not take more medicine than directed. Do not stop taking this medicine suddenly. This could lead to serious heart-related effects. Talk to your pediatrician regarding the use of this medicine in children. While this drug may be prescribed for children as young as 6 years for selected conditions, precautions do apply. Overdosage: If you think you have taken too much of this medicine contact a poison control center or emergency room at once. NOTE: This medicine is only for you. Do not share this medicine with others. What if I miss a dose? If you miss a dose, take it as soon as you can. If it is almost time for your next dose, take only that dose. Do not take double or extra doses. What may interact with this medicine? This medicine may interact with the following medications: -certain medicines for blood pressure, heart disease, irregular heart beat -certain medicines for depression, like monoamine oxidase (MAO) inhibitors, fluoxetine, or paroxetine -clonidine -dobutamine -epinephrine -isoproterenol -reserpine This list may not describe all possible interactions. Give your health care provider a list of all the medicines, herbs, non-prescription drugs, or dietary supplements you use. Also tell them if you smoke, drink alcohol, or use illegal drugs. Some items may interact with your medicine. What should I watch for while using  this medicine? Visit your doctor or health care professional for regular check ups. Contact your doctor right  away if your symptoms worsen. Check your blood pressure and pulse rate regularly. Ask your health care professional what your blood pressure and pulse rate should be, and when you should contact them. You may get drowsy or dizzy. Do not drive, use machinery, or do anything that needs mental alertness until you know how this medicine affects you. Do not sit or stand up quickly, especially if you are an older patient. This reduces the risk of dizzy or fainting spells. Contact your doctor if these symptoms continue. Alcohol may interfere with the effect of this medicine. Avoid alcoholic drinks. What side effects may I notice from receiving this medicine? Side effects that you should report to your doctor or health care professional as soon as possible: -allergic reactions like skin rash, itching or hives -cold or numb hands or feet -depression -difficulty breathing -faint -fever with sore throat -irregular heartbeat, chest pain -rapid weight gain -swollen legs or ankles Side effects that usually do not require medical attention (report to your doctor or health care professional if they continue or are bothersome): -anxiety or nervousness -change in sex drive or performance -dry skin -headache -nightmares or trouble sleeping -short term memory loss -stomach upset or diarrhea -unusually tired This list may not describe all possible side effects. Call your doctor for medical advice about side effects. You may report side effects to FDA at 1-800-FDA-1088. Where should I keep my medicine? Keep out of the reach of children. Store at room temperature between 15 and 30 degrees C (59 and 86 degrees F). Throw away any unused medicine after the expiration date. NOTE: This sheet is a summary. It may not cover all possible information. If you have questions about this medicine, talk to your doctor, pharmacist, or health care provider.  2018 Elsevier/Gold Standard (2012-10-06 14:41:37)

## 2017-06-02 ENCOUNTER — Ambulatory Visit (HOSPITAL_COMMUNITY): Payer: 59 | Attending: Cardiology

## 2017-06-02 ENCOUNTER — Other Ambulatory Visit: Payer: Self-pay

## 2017-06-02 ENCOUNTER — Ambulatory Visit (INDEPENDENT_AMBULATORY_CARE_PROVIDER_SITE_OTHER): Payer: 59

## 2017-06-02 DIAGNOSIS — R0789 Other chest pain: Secondary | ICD-10-CM | POA: Insufficient documentation

## 2017-06-02 DIAGNOSIS — F419 Anxiety disorder, unspecified: Secondary | ICD-10-CM | POA: Insufficient documentation

## 2017-06-02 DIAGNOSIS — R002 Palpitations: Secondary | ICD-10-CM | POA: Diagnosis not present

## 2017-06-03 LAB — EXERCISE TOLERANCE TEST
CHL CUP MPHR: 190 {beats}/min
CSEPED: 9 min
Estimated workload: 10.4 METS
Exercise duration (sec): 12 s
Peak HR: 179 {beats}/min
Percent HR: 94 %
RPE: 17
Rest HR: 65 {beats}/min

## 2017-06-24 DIAGNOSIS — J069 Acute upper respiratory infection, unspecified: Secondary | ICD-10-CM | POA: Diagnosis not present

## 2017-09-13 DIAGNOSIS — M25562 Pain in left knee: Secondary | ICD-10-CM | POA: Diagnosis not present

## 2018-05-03 ENCOUNTER — Other Ambulatory Visit: Payer: Self-pay | Admitting: Cardiology

## 2018-05-04 ENCOUNTER — Telehealth: Payer: Self-pay | Admitting: Cardiology

## 2018-05-18 ENCOUNTER — Telehealth: Payer: Self-pay | Admitting: Cardiology

## 2018-05-18 NOTE — Telephone Encounter (Signed)
Called patient and left message to call the office for Consent and Pre-registration

## 2018-05-22 ENCOUNTER — Telehealth (INDEPENDENT_AMBULATORY_CARE_PROVIDER_SITE_OTHER): Payer: 59 | Admitting: Cardiology

## 2018-05-22 ENCOUNTER — Other Ambulatory Visit: Payer: Self-pay

## 2018-05-22 ENCOUNTER — Encounter: Payer: Self-pay | Admitting: Cardiology

## 2018-05-22 DIAGNOSIS — R002 Palpitations: Secondary | ICD-10-CM

## 2018-05-22 NOTE — Progress Notes (Signed)
Virtual Visit via Video Note   This visit type was conducted due to national recommendations for restrictions regarding the COVID-19 Pandemic (e.g. social distancing) in an effort to limit this patient's exposure and mitigate transmission in our community.  Due to her co-morbid illnesses, this patient is at least at moderate risk for complications without adequate follow up.  This format is felt to be most appropriate for this patient at this time.  All issues noted in this document were discussed and addressed.  A limited physical exam was performed with this format.  Please refer to the patient's chart for her consent to telehealth for [facility].   Evaluation Performed:  Follow-up visit  Date:  05/22/2018   ID:  Megan Stafford, DOB 07/24/1985, MRN [id number]  Patient Location: Home  Provider Location: Home  PCP:  Renaldo Reel, PA  Cardiologist:  No primary care provider on file.  Electrophysiologist:  None   Chief Complaint:  Palpitations  History of Present Illness:    Megan Stafford is a 33 y.o. female who presents via audio/video conferencing for a telehealth visit today.    Patient has history of palpitations.  She denies any problems at this time and takes care of activities of daily living.  No chest pain orthopnea or PND.  She exercises on a regular basis.  She takes beta-blocker on a regular basis.  The day she does not take beta-blocker she meant.  She knows to keep herself well-hydrated during warm weather.  The patient does not have symptoms concerning for COVID-19 infection (fever, chills, cough, or new shortness of breath).    Past Medical History:  Diagnosis Date  . Anxiety    Past Surgical History:  Procedure Laterality Date  . addenoidectomy  2001  . RHINOPLASTY  2001     Current Meds  Medication Sig  . albuterol (VENTOLIN HFA) 108 (90 Base) MCG/ACT inhaler Inhale 90 mcg into the lungs daily.  Marland Kitchen ELDERBERRY PO Take 575 mg by mouth daily as needed.  .  fluticasone (FLONASE) 50 MCG/ACT nasal spray Place 2 sprays into both nostrils daily.  . metoprolol succinate (TOPROL-XL) 25 MG 24 hr tablet TAKE 1 TABLET BY MOUTH EVERY DAY  . sertraline (ZOLOFT) 100 MG tablet Take 100 mg by mouth daily.     Allergies:   Escitalopram oxalate; Bupivacaine hcl; Dextromethorphan; and Vortioxetine   Social History   Tobacco Use  . Smoking status: Former Research scientist (life sciences)  . Smokeless tobacco: Never Used  Substance Use Topics  . Alcohol use: Yes    Comment: Occasional alcohol use  . Drug use: No     Family Hx: The patient's family history includes Arthritis in her father and paternal grandfather; Asthma in her father and mother; Atrial fibrillation in her maternal grandfather, mother, and paternal grandmother; Cancer in her paternal aunt and paternal grandmother; Diabetes in her maternal grandfather, maternal grandmother, and maternal uncle; Heart disease in her maternal grandfather and maternal grandmother; Hyperlipidemia in her maternal grandfather and maternal grandmother; Hypertension in her maternal grandfather, maternal grandmother, and mother.  ROS:   Please see the history of present illness.    Review of systems was unremarkable.  Patient is asymptomatic. All other systems reviewed and are negative.   Prior CV studies:   The following studies were reviewed today:  Reports of echocardiogram and monitoring were discussed with the patient at length  Labs/Other Tests and Data Reviewed:    EKG:  No ECG reviewed.  Recent Labs: No results  found for requested labs within last 8760 hours.   Recent Lipid Panel No results found for: CHOL, TRIG, HDL, CHOLHDL, LDLCALC, LDLDIRECT  Wt Readings from Last 3 Encounters:  05/22/18 169 lb (76.7 kg)  05/20/17 156 lb (70.8 kg)  11/19/14 196 lb (88.9 kg)     Objective:    Vital Signs:  BP 100/70 (BP Location: Left Arm, Patient Position: Sitting, Cuff Size: Normal)   Pulse 64   Ht 5\' 3"  (1.6 m)   Wt 169 lb  (76.7 kg)   BMI 29.94 kg/m    Well nourished, well developed female in no acute distress. Patient appeared comfortable and was cheerful and in no distress.  ASSESSMENT & PLAN:    1. Palpitations: These have largely resolved with the patient is active.  She denies any symptoms.  Her blood work is managed by her primary care physician.  She is very active and exercises on a regular basis.Patient will be seen in follow-up appointment in 6 months or earlier if the patient has any concerns   COVID-19 Education: The signs and symptoms of COVID-19 were discussed with the patient and how to seek care for testing (follow up with PCP or arrange E-visit).  The importance of social distancing was discussed today.  Time:   Today, I have spent 16 minutes with the patient with telehealth technology discussing the above problems.     Medication Adjustments/Labs and Tests Ordered: Current medicines are reviewed at length with the patient today.  Concerns regarding medicines are outlined above.  Tests Ordered: No orders of the defined types were placed in this encounter.  Medication Changes: No orders of the defined types were placed in this encounter.   Disposition:  Follow up in 6 month(s)  Signed, Jenean Lindau, MD  05/22/2018 2:37 PM    [place]

## 2018-05-22 NOTE — Patient Instructions (Signed)

## 2018-05-23 NOTE — Telephone Encounter (Signed)
done

## 2018-05-24 ENCOUNTER — Ambulatory Visit: Payer: 59 | Admitting: Cardiology

## 2018-06-16 DIAGNOSIS — T7840XA Allergy, unspecified, initial encounter: Secondary | ICD-10-CM | POA: Diagnosis not present

## 2018-11-23 ENCOUNTER — Ambulatory Visit: Payer: 59 | Admitting: Cardiology

## 2019-02-22 ENCOUNTER — Other Ambulatory Visit: Payer: Self-pay | Admitting: Cardiology

## 2019-04-30 ENCOUNTER — Other Ambulatory Visit: Payer: Self-pay | Admitting: Cardiology

## 2019-05-27 ENCOUNTER — Other Ambulatory Visit: Payer: Self-pay | Admitting: Cardiology

## 2019-06-24 ENCOUNTER — Other Ambulatory Visit: Payer: Self-pay | Admitting: Cardiology

## 2019-09-06 IMAGING — DX DG CHEST 2V
2 series · 2 of 2 positions shown · non-contrast
Comparison: None.

CLINICAL DATA: 31-year-old female with chest pain.

EXAM:
CHEST - 2 VIEW

[chest pa]
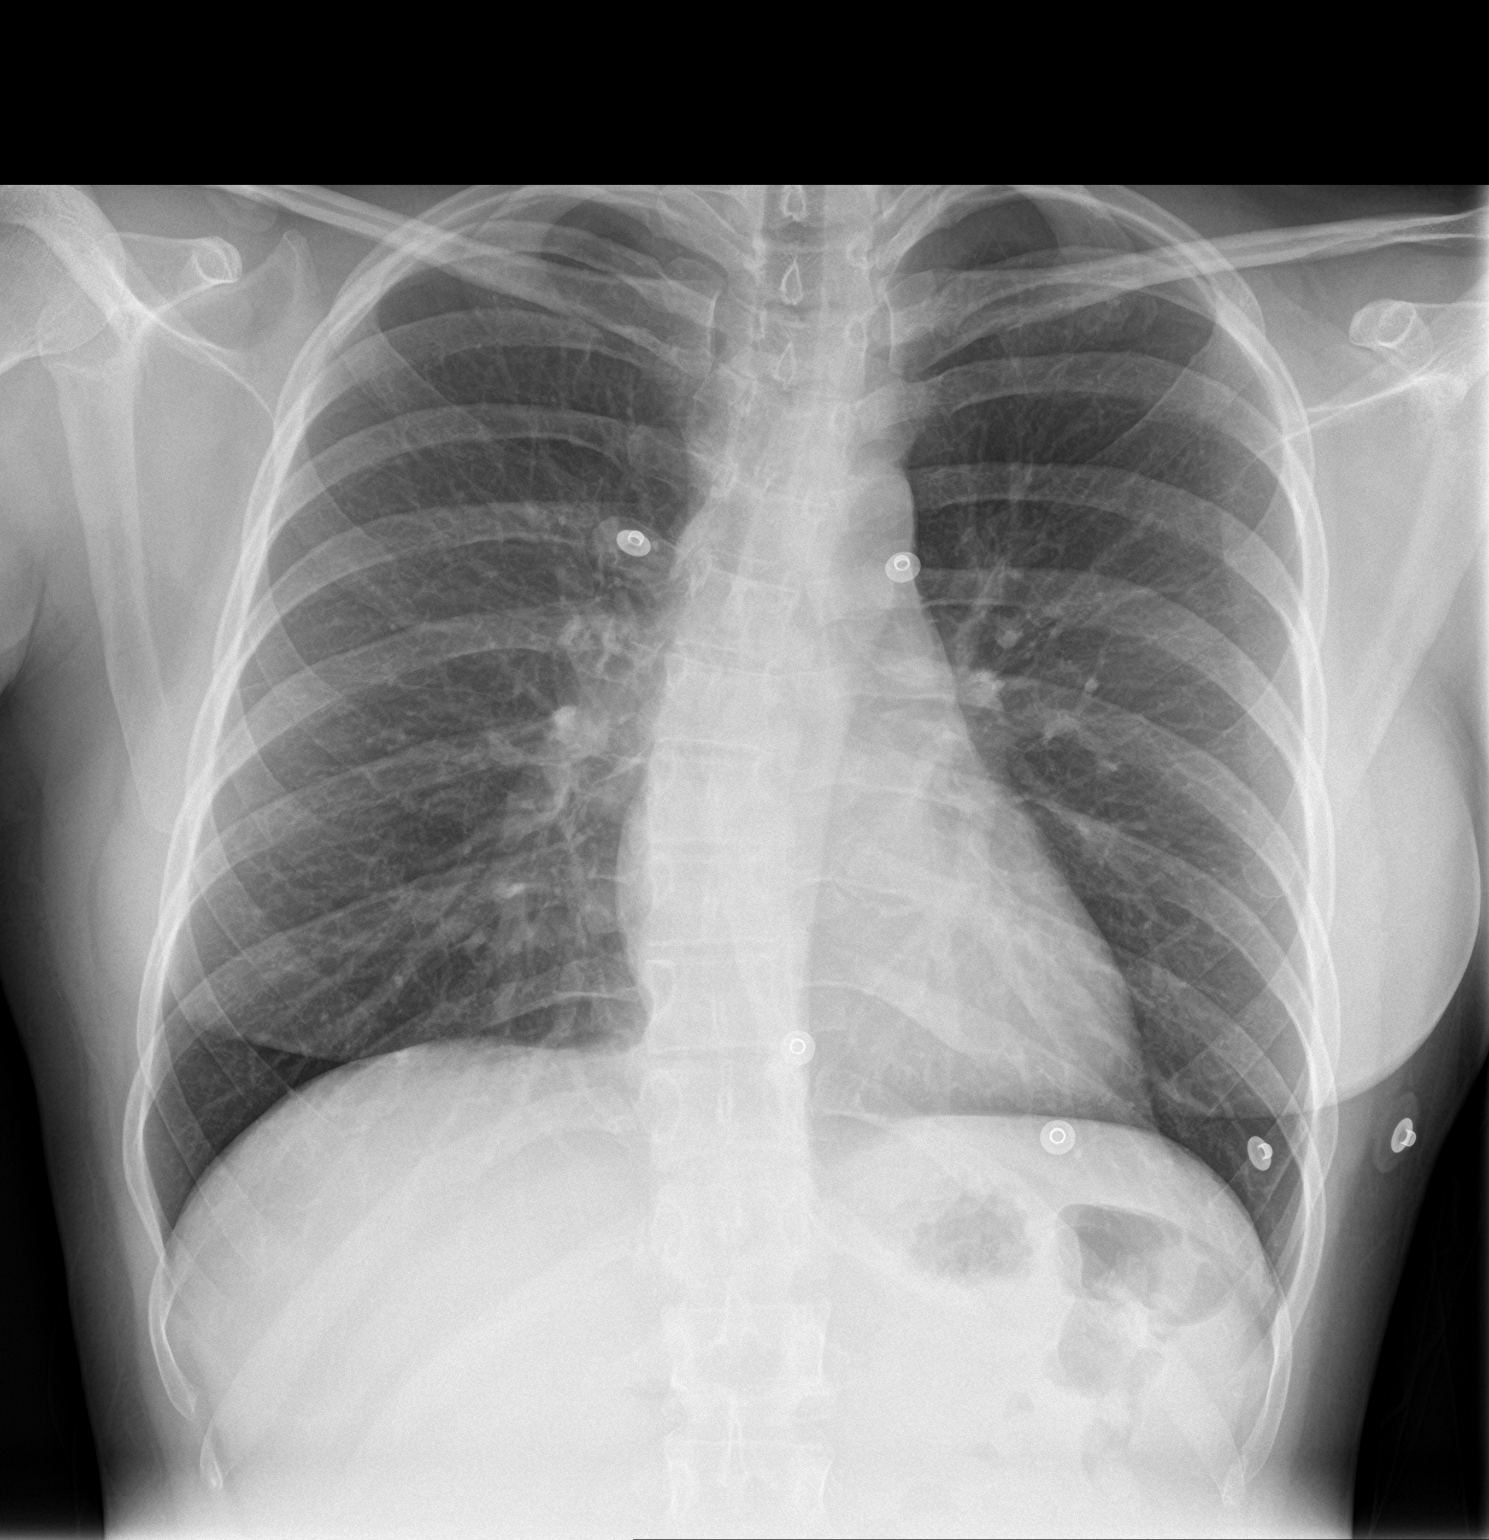

[chest lat]
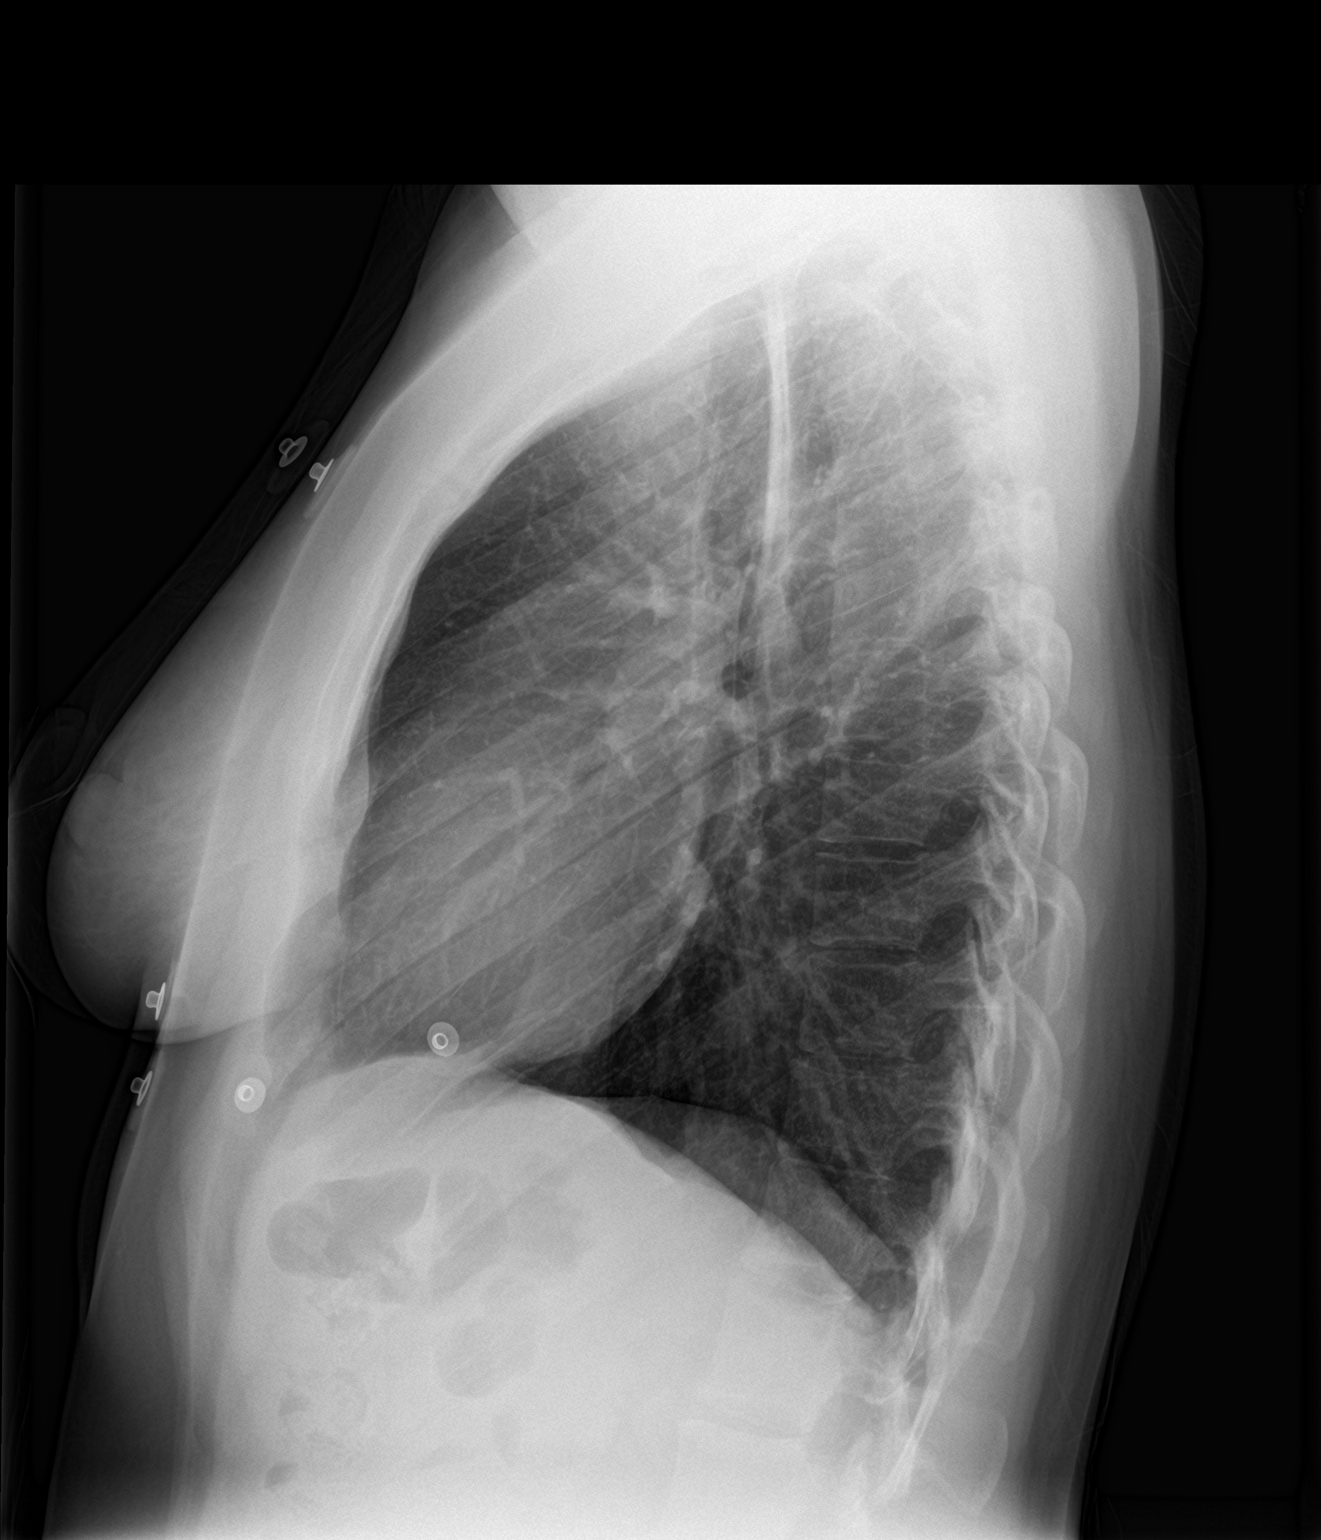

[2 of 2 positions shown; findings below may reference images not displayed]

FINDINGS: The heart size and mediastinal contours are within normal limits.
Both lungs are clear. The visualized skeletal structures are
unremarkable.
IMPRESSION: No active cardiopulmonary disease.

## 2019-11-05 ENCOUNTER — Other Ambulatory Visit: Payer: Self-pay | Admitting: Cardiology

## 2019-11-26 ENCOUNTER — Other Ambulatory Visit: Payer: Self-pay | Admitting: Cardiology

## 2019-11-26 MED ORDER — METOPROLOL SUCCINATE ER 25 MG PO TB24: 25.0000 mg | ORAL_TABLET | Freq: Every day | ORAL | 0 refills | Status: AC

## 2019-11-26 NOTE — Telephone Encounter (Signed)
*  STAT* If patient is at the pharmacy, call can be transferred to refill team.   1. Which medications need to be refilled? (please list name of each medication and dose if known)  metoprolol succinate (TOPROL-XL) 25 MG 24 hr tablet 2. Which pharmacy/location (including street and city if local pharmacy) is medication to be sent to? [id number] - [place], [place]  3. Do they need a 30 day or 90 day supply? 30 day supply   Pt has been out of medication for 2 days. An appt has been scheduled for 12/18/19.

## 2019-11-26 NOTE — Telephone Encounter (Signed)
Medication filled.  

## 2019-12-17 ENCOUNTER — Other Ambulatory Visit: Payer: Self-pay

## 2019-12-18 ENCOUNTER — Ambulatory Visit: Payer: 59 | Admitting: Cardiology

## 2020-01-01 ENCOUNTER — Telehealth: Payer: Self-pay | Admitting: Cardiology

## 2020-01-01 NOTE — Telephone Encounter (Signed)
Pt c/o medication issue:  1. Name of Medication: metoprolol succinate (TOPROL-XL) 25 MG 24 hr tablet  2. How are you currently taking this medication (dosage and times per day)? 1 tablet by mouth daily   3. Are you having a reaction (difficulty breathing--STAT)? No   4. What is your medication issue? [place] is calling wanting to know if she can start getting her metoprolol filled through her PCP due to seeing her more frequently. She states her PCP advised her she was okay with doing it, but requested she verify Dr. Geraldo Pitter is okay with it first. [place] states if she is unable to answer when calling back please leave a detailed message. Please advise.

## 2020-01-01 NOTE — Telephone Encounter (Signed)
Called patient to let her know it is fine for PCP to order her metoprolol. Patient verbalized understanding.

## 2021-01-13 ENCOUNTER — Other Ambulatory Visit: Payer: Self-pay

## 2021-01-14 ENCOUNTER — Other Ambulatory Visit: Payer: Self-pay

## 2021-01-14 ENCOUNTER — Ambulatory Visit (INDEPENDENT_AMBULATORY_CARE_PROVIDER_SITE_OTHER): Payer: 59

## 2021-01-14 ENCOUNTER — Encounter: Payer: Self-pay | Admitting: Cardiology

## 2021-01-14 ENCOUNTER — Ambulatory Visit: Payer: 59 | Admitting: Cardiology

## 2021-01-14 VITALS — BP 128/76 | HR 90 | Ht 63.0 in | Wt 170.1 lb

## 2021-01-14 DIAGNOSIS — R002 Palpitations: Secondary | ICD-10-CM

## 2021-01-14 NOTE — Patient Instructions (Signed)
Medication Instructions:  Your physician recommends that you continue on your current medications as directed. Please refer to the Current Medication list given to you today.  *If you need a refill on your cardiac medications before your next appointment, please call your pharmacy*   Lab Work: None ordered If you have labs (blood work) drawn today and your tests are completely normal, you will receive your results only by: [place] (if you have MyChart) OR A paper copy in the mail If you have any lab test that is abnormal or we need to change your treatment, we will call you to review the results.   Testing/Procedures:   WHY IS MY DOCTOR PRESCRIBING ZIO? The Zio system is proven and trusted by physicians to detect and diagnose irregular heart rhythms -- and has been prescribed to hundreds of thousands of patients.  The FDA has cleared the Zio system to monitor for many different kinds of irregular heart rhythms. In a study, physicians were able to reach a diagnosis 90% of the time with the Zio system1.  You can wear the Zio monitor -- a small, discreet, comfortable patch -- during your normal day-to-day activity, including while you sleep, shower, and exercise, while it records every single heartbeat for analysis.  1Barrett, P., et al. Comparison of 24 Hour Holter Monitoring Versus 14 Day Novel Adhesive Patch Electrocardiographic Monitoring. [place]ont[place], 2014.  ZIO VS. HOLTER MONITORING The Zio monitor can be comfortably worn for up to 14 days. Holter monitors can be worn for 24 to 48 hours, limiting the time to record any irregular heart rhythms you may have. Zio is able to capture data for the 51% of patients who have their first symptom-triggered arrhythmia after 48 hours.1  LIVE WITHOUT RESTRICTIONS The Zio ambulatory cardiac monitor is a small, unobtrusive, and water-resistant patch--you might even forget you're wearing it. The Zio monitor records and stores  every beat of your heart, whether you're sleeping, working out, or showering.  Wear the monitor for 14 days, remove 01/28/21.   Follow-Up: At [facility], you and your health needs are our priority.  As part of our continuing mission to provide you with exceptional heart care, we have created designated Provider Care Teams.  These Care Teams include your primary Cardiologist (physician) and Advanced Practice Providers (APPs -  Physician Assistants and Nurse Practitioners) who all work together to provide you with the care you need, when you need it.  We recommend signing up for the patient portal called "MyChart".  Sign up information is provided on this After Visit Summary.  MyChart is used to connect with patients for Virtual Visits (Telemedicine).  Patients are able to view lab/test results, encounter notes, upcoming appointments, etc.  Non-urgent messages can be sent to your provider as well.   To learn more about what you can do with MyChart, go to [email or web address].    Your next appointment:   2 month(s)  The format for your next appointment:   In Person  Provider:   Jyl Heinz, MD   Other Instructions NA

## 2021-01-14 NOTE — Progress Notes (Signed)
Cardiology Office Note:    Date:  01/14/2021   ID:  Megan Stafford, DOB 1985-07-27, MRN [id number]  PCP:  Renaldo Reel, PA  Cardiologist:  Jenean Lindau, MD   Referring MD: Renaldo Reel, PA    ASSESSMENT:    1. Palpitations    PLAN:    In order of problems listed above:  Primary prevention stressed with the patient.  Importance of compliance with diet medication stressed and she vocalized understanding. Palpitations: She appears to have inappropriate tachycardia.  I told her to get complete blood work and she is getting a physical in the next few days with her primary care.  I told her to send me a copy of those records.  I would be more focused on things like hemoglobin and TSH to see if any of these are giving her tachycardia.  She works at a [employer] and I discussed this and she understands.  I also told her to get off beta-blocker for the next few days so we can understand if she has any significant tachyarrhythmias.  Beta-blockers can mask weeks.  If her heart rate significantly elevated she can use on a as needed basis undocumented. She knows to go to the nearest emergency room for any significant symptoms. Patient will be seen in follow-up appointment in 2 months or earlier if the patient has any concerns    Medication Adjustments/Labs and Tests Ordered: Current medicines are reviewed at length with the patient today.  Concerns regarding medicines are outlined above.  No orders of the defined types were placed in this encounter.  No orders of the defined types were placed in this encounter.    No chief complaint on file.    History of Present Illness:    Megan Stafford is a 35 y.o. female.  Patient has past medical history of palpitations.  She is on beta-blocker for this reason.  She has also history of anxiety in general.  No orthopnea or PND.  She tells me that she is focused more on prayer and meditation and that has helped her.  She mentions to me that  her heart rate goes into the 130s at times.  No chest pain orthopnea or PND.  She takes care of activities of daily living.  She has not had dizziness or passing out spell.  Past Medical History:  Diagnosis Date   Anxiety    Breast tenderness in female 05/16/2017   Chest tightness 05/16/2017   Indication for care in labor or delivery 11/19/2014   Lightheadedness 05/16/2017   Menorrhagia 05/16/2017   Nausea 05/16/2017   Palpitations 05/20/2017   Postpartum state 11/20/2014   Tingling 05/16/2017   Bilateral hands and fingers.    Past Surgical History:  Procedure Laterality Date   addenoidectomy  2001   RHINOPLASTY  2001    Current Medications: Current Meds  Medication Sig   albuterol (VENTOLIN HFA) 108 (90 Base) MCG/ACT inhaler Inhale 1 puff into the lungs as needed for wheezing or shortness of breath.   ELDERBERRY PO Take 575 mg by mouth daily.   fluticasone (FLONASE) 50 MCG/ACT nasal spray Place 2 sprays into both nostrils as needed for allergies or rhinitis.   metoprolol succinate (TOPROL-XL) 25 MG 24 hr tablet Take 1 tablet (25 mg total) by mouth daily.   OZEMPIC, 2 MG/DOSE, 8 MG/3ML SOPN Inject into the skin once a week.   venlafaxine XR (EFFEXOR-XR) 150 MG 24 hr capsule Take 150 mg by mouth daily.  Vitamin D, Ergocalciferol, (DRISDOL) 1.25 MG (50000 UNIT) CAPS capsule Take 50,000 Units by mouth once a week.     Allergies:   Escitalopram oxalate, Bupivacaine hcl, Dextromethorphan, and Vortioxetine   Social History   Socioeconomic History   Marital status: Married    Spouse name: Not on file   Number of children: Not on file   Years of education: Not on file   Highest education level: Not on file  Occupational History   Not on file  Tobacco Use   Smoking status: Former   Smokeless tobacco: Never  Substance and Sexual Activity   Alcohol use: Yes    Comment: Occasional alcohol use   Drug use: No   Sexual activity: Yes    Birth control/protection: I.U.D.  Other Topics Concern    Not on file  Social History Narrative   Not on file   Social Determinants of Health   Financial Resource Strain: Not on file  Food Insecurity: Not on file  Transportation Needs: Not on file  Physical Activity: Not on file  Stress: Not on file  Social Connections: Not on file     Family History: The patient's family history includes Arthritis in her father and paternal grandfather; Asthma in her father and mother; Atrial fibrillation in her maternal grandfather, mother, and paternal grandmother; Cancer in her paternal aunt and paternal grandmother; Diabetes in her maternal grandfather, maternal grandmother, and maternal uncle; Heart disease in her maternal grandfather and maternal grandmother; Hyperlipidemia in her maternal grandfather and maternal grandmother; Hypertension in her maternal grandfather, maternal grandmother, and mother.  ROS:   Please see the history of present illness.    All other systems reviewed and are negative.  EKGs/Labs/Other Studies Reviewed:    The following studies were reviewed today: EKG reveals sinus rhythm and nonspecific ST-T changes   Recent Labs: No results found for requested labs within last 8760 hours.  Recent Lipid Panel No results found for: CHOL, TRIG, HDL, CHOLHDL, VLDL, LDLCALC, LDLDIRECT  Physical Exam:    VS:  BP 128/76   Pulse 90   Ht 5\' 3"  (1.6 m)   Wt 170 lb 1.3 oz (77.1 kg)   SpO2 98%   BMI 30.13 kg/m     Wt Readings from Last 3 Encounters:  01/14/21 170 lb 1.3 oz (77.1 kg)  05/22/18 169 lb (76.7 kg)  05/20/17 156 lb (70.8 kg)     GEN: Patient is in no acute distress HEENT: Normal NECK: No JVD; No carotid bruits LYMPHATICS: No lymphadenopathy CARDIAC: Hear sounds regular, 2/6 systolic murmur at the apex. RESPIRATORY:  Clear to auscultation without rales, wheezing or rhonchi  ABDOMEN: Soft, non-tender, non-distended MUSCULOSKELETAL:  No edema; No deformity  SKIN: Warm and dry NEUROLOGIC:  Alert and oriented x  3 PSYCHIATRIC:  Normal affect   Signed, Jenean Lindau, MD  01/14/2021 9:01 AM    [place]

## 2021-04-08 ENCOUNTER — Ambulatory Visit: Payer: 59 | Admitting: Cardiology

## 2021-04-08 ENCOUNTER — Other Ambulatory Visit: Payer: Self-pay
